# Patient Record
Sex: Female | Born: 1935 | Race: White | Hispanic: No | Marital: Single | Smoking: Current every day smoker
Health system: Southern US, Community
[De-identification: ages and names within clinical notes are randomized; demographics above are authoritative.]

## PROBLEM LIST (undated history)

## (undated) DIAGNOSIS — D649 Anemia, unspecified: Secondary | ICD-10-CM

## (undated) DIAGNOSIS — M1990 Unspecified osteoarthritis, unspecified site: Secondary | ICD-10-CM

## (undated) DIAGNOSIS — R0989 Other specified symptoms and signs involving the circulatory and respiratory systems: Secondary | ICD-10-CM

## (undated) DIAGNOSIS — T7840XA Allergy, unspecified, initial encounter: Secondary | ICD-10-CM

## (undated) DIAGNOSIS — R040 Epistaxis: Secondary | ICD-10-CM

## (undated) DIAGNOSIS — J449 Chronic obstructive pulmonary disease, unspecified: Secondary | ICD-10-CM

## (undated) DIAGNOSIS — C3490 Malignant neoplasm of unspecified part of unspecified bronchus or lung: Secondary | ICD-10-CM

## (undated) DIAGNOSIS — H269 Unspecified cataract: Secondary | ICD-10-CM

## (undated) DIAGNOSIS — I10 Essential (primary) hypertension: Secondary | ICD-10-CM

## (undated) DIAGNOSIS — Z87440 Personal history of urinary (tract) infections: Secondary | ICD-10-CM

## (undated) DIAGNOSIS — M0579 Rheumatoid arthritis with rheumatoid factor of multiple sites without organ or systems involvement: Secondary | ICD-10-CM

## (undated) DIAGNOSIS — K219 Gastro-esophageal reflux disease without esophagitis: Secondary | ICD-10-CM

## (undated) DIAGNOSIS — R221 Localized swelling, mass and lump, neck: Secondary | ICD-10-CM

## (undated) HISTORY — DX: Allergy, unspecified, initial encounter: T78.40XA

## (undated) HISTORY — DX: Localized swelling, mass and lump, neck: R22.1

## (undated) HISTORY — DX: Malignant neoplasm of unspecified part of unspecified bronchus or lung: C34.90

## (undated) HISTORY — DX: Unspecified cataract: H26.9

## (undated) HISTORY — DX: Chronic obstructive pulmonary disease, unspecified: J44.9

## (undated) HISTORY — PX: ABDOMINAL HYSTERECTOMY: SHX81

## (undated) HISTORY — DX: Personal history of urinary (tract) infections: Z87.440

## (undated) HISTORY — PX: APPENDECTOMY: SHX54

## (undated) HISTORY — DX: Unspecified osteoarthritis, unspecified site: M19.90

## (undated) HISTORY — DX: Other specified symptoms and signs involving the circulatory and respiratory systems: R09.89

## (undated) HISTORY — DX: Rheumatoid arthritis with rheumatoid factor of multiple sites without organ or systems involvement: M05.79

## (undated) HISTORY — PX: NECK LESION BIOPSY: SHX2078

## (undated) HISTORY — DX: Epistaxis: R04.0

## (undated) HISTORY — DX: Gastro-esophageal reflux disease without esophagitis: K21.9

## (undated) HISTORY — PX: HIP SURGERY: SHX245

## (undated) HISTORY — DX: Anemia, unspecified: D64.9

## (undated) HISTORY — DX: Essential (primary) hypertension: I10

---

## 2013-11-29 ENCOUNTER — Other Ambulatory Visit: Payer: Self-pay | Admitting: Family Medicine

## 2013-11-29 LAB — CBC WITH DIFFERENTIAL/PLATELET
BASOS PCT: 0.6 %
Basophil #: 0.1 10*3/uL (ref 0.0–0.1)
Eosinophil #: 0.1 10*3/uL (ref 0.0–0.7)
Eosinophil %: 1.3 %
HCT: 32 % — ABNORMAL LOW (ref 35.0–47.0)
HGB: 10.6 g/dL — ABNORMAL LOW (ref 12.0–16.0)
LYMPHS PCT: 17.4 %
Lymphocyte #: 1.8 10*3/uL (ref 1.0–3.6)
MCH: 27.2 pg (ref 26.0–34.0)
MCHC: 33.2 g/dL (ref 32.0–36.0)
MCV: 82 fL (ref 80–100)
Monocyte #: 1.1 x10 3/mm — ABNORMAL HIGH (ref 0.2–0.9)
Monocyte %: 10.9 %
NEUTROS PCT: 69.8 %
Neutrophil #: 7.1 10*3/uL — ABNORMAL HIGH (ref 1.4–6.5)
Platelet: 703 10*3/uL — ABNORMAL HIGH (ref 150–440)
RBC: 3.91 10*6/uL (ref 3.80–5.20)
RDW: 15.9 % — ABNORMAL HIGH (ref 11.5–14.5)
WBC: 10.1 10*3/uL (ref 3.6–11.0)

## 2013-11-29 LAB — BASIC METABOLIC PANEL
ANION GAP: 11 (ref 7–16)
BUN: 14 mg/dL (ref 7–18)
Calcium, Total: 8.3 mg/dL — ABNORMAL LOW (ref 8.5–10.1)
Chloride: 99 mmol/L (ref 98–107)
Co2: 26 mmol/L (ref 21–32)
Creatinine: 0.53 mg/dL — ABNORMAL LOW (ref 0.60–1.30)
Glucose: 68 mg/dL (ref 65–99)
Osmolality: 271 (ref 275–301)
POTASSIUM: 4.5 mmol/L (ref 3.5–5.1)
Sodium: 136 mmol/L (ref 136–145)

## 2013-11-29 LAB — SEDIMENTATION RATE: Erythrocyte Sed Rate: 72 mm/hr — ABNORMAL HIGH (ref 0–30)

## 2013-11-29 LAB — VANCOMYCIN, TROUGH: VANCOMYCIN, TROUGH: 22 ug/mL — AB (ref 10–20)

## 2013-11-30 ENCOUNTER — Other Ambulatory Visit: Payer: Self-pay | Admitting: Family Medicine

## 2013-11-30 LAB — CREATININE, SERUM
Creatinine: 0.47 mg/dL — ABNORMAL LOW (ref 0.60–1.30)
EGFR (Non-African Amer.): >60

## 2013-11-30 LAB — VANCOMYCIN, TROUGH: Vancomycin, Trough: 16 ug/mL (ref 10–20)

## 2013-12-05 ENCOUNTER — Other Ambulatory Visit: Payer: Self-pay | Admitting: Family Medicine

## 2013-12-05 LAB — CBC WITH DIFFERENTIAL/PLATELET
Basophil #: 0.1 10*3/uL (ref 0.0–0.1)
Basophil %: 0.7 %
Eosinophil #: 0.1 10*3/uL (ref 0.0–0.7)
Eosinophil %: 1.1 %
HCT: 31.7 % — ABNORMAL LOW (ref 35.0–47.0)
HGB: 10 g/dL — AB (ref 12.0–16.0)
Lymphocyte #: 3.3 10*3/uL (ref 1.0–3.6)
Lymphocyte %: 26.7 %
MCH: 25.9 pg — AB (ref 26.0–34.0)
MCHC: 31.7 g/dL — ABNORMAL LOW (ref 32.0–36.0)
MCV: 82 fL (ref 80–100)
MONO ABS: 1.4 x10 3/mm — AB (ref 0.2–0.9)
Monocyte %: 11.4 %
Neutrophil #: 7.4 10*3/uL — ABNORMAL HIGH (ref 1.4–6.5)
Neutrophil %: 60.1 %
Platelet: 677 10*3/uL — ABNORMAL HIGH (ref 150–440)
RBC: 3.87 10*6/uL (ref 3.80–5.20)
RDW: 16.4 % — ABNORMAL HIGH (ref 11.5–14.5)
WBC: 12.2 10*3/uL — AB (ref 3.6–11.0)

## 2013-12-05 LAB — BASIC METABOLIC PANEL
ANION GAP: 8 (ref 7–16)
BUN: 5 mg/dL — AB (ref 7–18)
CHLORIDE: 101 mmol/L (ref 98–107)
CREATININE: 0.46 mg/dL — AB (ref 0.60–1.30)
Calcium, Total: 8 mg/dL — ABNORMAL LOW (ref 8.5–10.1)
Co2: 27 mmol/L (ref 21–32)
EGFR (African American): >60
EGFR (Non-African Amer.): >60
GLUCOSE: 89 mg/dL (ref 65–99)
OSMOLALITY: 269 (ref 275–301)
POTASSIUM: 3.6 mmol/L (ref 3.5–5.1)
SODIUM: 136 mmol/L (ref 136–145)

## 2013-12-05 LAB — VANCOMYCIN, TROUGH: Vancomycin, Trough: 14 ug/mL (ref 10–20)

## 2013-12-05 LAB — SEDIMENTATION RATE: ERYTHROCYTE SED RATE: 50 mm/h — AB (ref 0–30)

## 2013-12-08 ENCOUNTER — Other Ambulatory Visit: Payer: Self-pay | Admitting: Family Medicine

## 2013-12-08 LAB — VANCOMYCIN, TROUGH: VANCOMYCIN, TROUGH: 14 ug/mL (ref 10–20)

## 2013-12-08 LAB — CREATININE, SERUM
Creatinine: 0.56 mg/dL — ABNORMAL LOW (ref 0.60–1.30)
EGFR (African American): >60

## 2013-12-13 ENCOUNTER — Other Ambulatory Visit: Payer: Self-pay | Admitting: Family Medicine

## 2013-12-13 LAB — CBC WITH DIFFERENTIAL/PLATELET
Basophil #: 0.1 10*3/uL (ref 0.0–0.1)
Basophil %: 0.8 %
Eosinophil #: 0.2 10*3/uL (ref 0.0–0.7)
Eosinophil %: 1.7 %
HCT: 31.1 % — AB (ref 35.0–47.0)
HGB: 9.9 g/dL — ABNORMAL LOW (ref 12.0–16.0)
LYMPHS ABS: 3.5 10*3/uL (ref 1.0–3.6)
Lymphocyte %: 31.3 %
MCH: 27.1 pg (ref 26.0–34.0)
MCHC: 31.9 g/dL — ABNORMAL LOW (ref 32.0–36.0)
MCV: 85 fL (ref 80–100)
MONOS PCT: 10.6 %
Monocyte #: 1.2 x10 3/mm — ABNORMAL HIGH (ref 0.2–0.9)
Neutrophil #: 6.2 10*3/uL (ref 1.4–6.5)
Neutrophil %: 55.6 %
PLATELETS: 614 10*3/uL — AB (ref 150–440)
RBC: 3.66 10*6/uL — ABNORMAL LOW (ref 3.80–5.20)
RDW: 20 % — ABNORMAL HIGH (ref 11.5–14.5)
WBC: 11.1 10*3/uL — ABNORMAL HIGH (ref 3.6–11.0)

## 2013-12-13 LAB — COMPREHENSIVE METABOLIC PANEL
ALBUMIN: 2 g/dL — AB (ref 3.4–5.0)
ALK PHOS: 221 U/L — AB
Anion Gap: 8 (ref 7–16)
BILIRUBIN TOTAL: 0.5 mg/dL (ref 0.2–1.0)
BUN: 10 mg/dL (ref 7–18)
CALCIUM: 8 mg/dL — AB (ref 8.5–10.1)
CREATININE: 0.48 mg/dL — AB (ref 0.60–1.30)
Chloride: 103 mmol/L (ref 98–107)
Co2: 28 mmol/L (ref 21–32)
EGFR (African American): >60
Glucose: 93 mg/dL (ref 65–99)
OSMOLALITY: 276 (ref 275–301)
Potassium: 3.1 mmol/L — ABNORMAL LOW (ref 3.5–5.1)
SGOT(AST): 50 U/L — ABNORMAL HIGH (ref 15–37)
SGPT (ALT): 20 U/L (ref 12–78)
SODIUM: 139 mmol/L (ref 136–145)
Total Protein: 6.6 g/dL (ref 6.4–8.2)

## 2013-12-13 LAB — VANCOMYCIN, TROUGH: Vancomycin, Trough: 18 ug/mL (ref 10–20)

## 2013-12-14 ENCOUNTER — Other Ambulatory Visit: Payer: Self-pay | Admitting: Family Medicine

## 2013-12-14 LAB — SEDIMENTATION RATE: Erythrocyte Sed Rate: 63 mm/hr — ABNORMAL HIGH (ref 0–30)

## 2013-12-16 ENCOUNTER — Other Ambulatory Visit: Payer: Self-pay | Admitting: Family Medicine

## 2013-12-16 LAB — CREATININE, SERUM
Creatinine: 0.94 mg/dL (ref 0.60–1.30)
EGFR (Non-African Amer.): 56 — ABNORMAL LOW

## 2013-12-16 LAB — VANCOMYCIN, TROUGH: VANCOMYCIN, TROUGH: 14 ug/mL (ref 10–20)

## 2013-12-19 ENCOUNTER — Other Ambulatory Visit: Payer: Self-pay | Admitting: Family Medicine

## 2013-12-19 LAB — CBC WITH DIFFERENTIAL/PLATELET
BASOS PCT: 1.2 %
Basophil #: 0.1 10*3/uL (ref 0.0–0.1)
Eosinophil #: 0.4 10*3/uL (ref 0.0–0.7)
Eosinophil %: 3.5 %
HCT: 32 % — ABNORMAL LOW (ref 35.0–47.0)
HGB: 10.1 g/dL — ABNORMAL LOW (ref 12.0–16.0)
LYMPHS ABS: 2.9 10*3/uL (ref 1.0–3.6)
Lymphocyte %: 26.4 %
MCH: 27.5 pg (ref 26.0–34.0)
MCHC: 31.5 g/dL — ABNORMAL LOW (ref 32.0–36.0)
MCV: 87 fL (ref 80–100)
Monocyte #: 1.2 x10 3/mm — ABNORMAL HIGH (ref 0.2–0.9)
Monocyte %: 10.3 %
Neutrophil #: 6.6 10*3/uL — ABNORMAL HIGH (ref 1.4–6.5)
Neutrophil %: 58.6 %
Platelet: 597 10*3/uL — ABNORMAL HIGH (ref 150–440)
RBC: 3.67 10*6/uL — ABNORMAL LOW (ref 3.80–5.20)
RDW: 21.6 % — ABNORMAL HIGH (ref 11.5–14.5)
WBC: 11.2 10*3/uL — ABNORMAL HIGH (ref 3.6–11.0)

## 2013-12-19 LAB — BASIC METABOLIC PANEL
Anion Gap: 5 — ABNORMAL LOW (ref 7–16)
BUN: 9 mg/dL (ref 7–18)
CREATININE: 0.62 mg/dL (ref 0.60–1.30)
Calcium, Total: 8.4 mg/dL — ABNORMAL LOW (ref 8.5–10.1)
Chloride: 103 mmol/L (ref 98–107)
Co2: 29 mmol/L (ref 21–32)
EGFR (African American): >60
Glucose: 81 mg/dL (ref 65–99)
OSMOLALITY: 272 (ref 275–301)
POTASSIUM: 4 mmol/L (ref 3.5–5.1)
Sodium: 137 mmol/L (ref 136–145)

## 2013-12-19 LAB — SEDIMENTATION RATE: ERYTHROCYTE SED RATE: 65 mm/h — AB (ref 0–30)

## 2013-12-20 ENCOUNTER — Other Ambulatory Visit: Payer: Self-pay | Admitting: Family Medicine

## 2013-12-20 LAB — CREATININE, SERUM
Creatinine: 0.49 mg/dL — ABNORMAL LOW (ref 0.60–1.30)
EGFR (African American): >60
EGFR (Non-African Amer.): >60

## 2013-12-20 LAB — VANCOMYCIN, TROUGH: VANCOMYCIN, TROUGH: 18 ug/mL (ref 10–20)

## 2013-12-22 ENCOUNTER — Other Ambulatory Visit: Payer: Self-pay | Admitting: Family Medicine

## 2013-12-22 LAB — CREATININE, SERUM
Creatinine: 0.72 mg/dL (ref 0.60–1.30)
EGFR (African American): >60

## 2013-12-22 LAB — HEPATIC FUNCTION PANEL A (ARMC)
ALBUMIN: 2.3 g/dL — AB (ref 3.4–5.0)
AST: 41 U/L — AB (ref 15–37)
Alkaline Phosphatase: 196 U/L — ABNORMAL HIGH
Bilirubin, Direct: 0.1 mg/dL (ref 0.00–0.20)
Bilirubin,Total: 0.4 mg/dL (ref 0.2–1.0)
SGPT (ALT): 17 U/L (ref 12–78)
Total Protein: 6.5 g/dL (ref 6.4–8.2)

## 2013-12-22 LAB — VANCOMYCIN, TROUGH: Vancomycin, Trough: 16 ug/mL (ref 10–20)

## 2016-04-29 ENCOUNTER — Ambulatory Visit
Admission: EM | Admit: 2016-04-29 | Discharge: 2016-04-29 | Disposition: A | Discharge status: Home / Self Care | Payer: Medicare Other | Attending: Family Medicine | Admitting: Family Medicine

## 2016-04-29 DIAGNOSIS — R1013 Epigastric pain: Secondary | ICD-10-CM

## 2016-04-29 DIAGNOSIS — R195 Other fecal abnormalities: Secondary | ICD-10-CM | POA: Diagnosis not present

## 2016-04-29 NOTE — ED Triage Notes (Signed)
Patient was hospitalized for C. Diff, she was disdcharged 1 week ago Friday,  saw her PCP on Tuesday and they prescribed Vancomycin. She feels like something is eating through her stomach. Diarrhea has been off and on since Tuesday.

## 2016-04-29 NOTE — ED Provider Notes (Signed)
[facility] ____________________________________________  Time seen: Approximately 6:38 PM  I have reviewed the triage vital signs and the nursing notes.   HISTORY  Chief Complaint Abdominal Pain   HPI Melinda Cobb is a 82 y.o. female patient presenting with son at bedside for the complaints of abdominal pain. Patient reports generalized abdominal pain worse in the epigastric area. Patient reports is been present for 1 week with associated black tarry stools. Patient describes her stool as coffee ground appearing. Patient denies any acute worsening of her abdominal pain. Patient reports decreased appetite and occasional nausea. Patient reports that she is drinking fluids well.  Patient reports that she was just discharged from [facility] due to C. difficile abdominal pain. Patient reports that she was originally being treated with Flagyl however was not tolerating well and is now currently on vancomycin. Patient reports that she is also been taken Bactrim due to a recurrent infected left hip joint.  Denies vomiting, diarrhea or constipation. Denies any other abnormal bleeding. Patient reports some generalized weakness. Denies chest pain, shortness of breath, dysuria. Patient reports that she caught her primary care physician today and they recommended for her to go to the urgent care or ER setting to be further evaluated due to a possible ulcer. Patient reports that she does have a history of a bleeding ulcer approximately 3 years ago with secondary transfusion due to anemia. Patient reports nonambulatory at baseline.  [place] PCP   History reviewed. No pertinent past medical history.  There are no active problems to display for this patient.   History reviewed. No pertinent surgical history.  Current Outpatient Rx  . Order #: [id number] Class: Historical Med  . Order #: [id number] Class: Historical Med  . Order #: [id number] Class: Historical Med  .  Order #: [id number] Class: Historical Med  . Order #: [id number] Class: Historical Med  . Order #: [id number] Class: Historical Med  . Order #: [id number] Class: Historical Med    No current facility-administered medications for this encounter.   Current Outpatient Prescriptions:  .  AMLODIPINE BESYLATE PO, Take 5 mg by mouth daily., Disp: , Rfl:  .  aspirin 81 MG chewable tablet, Chew by mouth daily., Disp: , Rfl:  .  atenolol (TENORMIN) 25 MG tablet, Take by mouth 2 (two) times daily., Disp: , Rfl:  .  Probiotic Product (TRUBIOTICS PO), Take by mouth., Disp: , Rfl:  .  simethicone (MYLICON) 353 MG chewable tablet, Chew 125 mg by mouth every 6 (six) hours as needed for flatulence., Disp: , Rfl:  .  sulfamethoxazole-trimethoprim (BACTRIM DS,SEPTRA DS) 800-160 MG tablet, Take 1 tablet by mouth daily., Disp: , Rfl:  .  vancomycin (VANCOCIN) 125 MG capsule, Take 125 mg by mouth 4 (four) times daily., Disp: , Rfl:   Allergies Review of patient's allergies indicates not on file.  History reviewed. No pertinent family history.  Social History Social History  Substance Use Topics  . Smoking status: Never Smoker  . Smokeless tobacco: Never Used  . Alcohol use No    Review of Systems Constitutional: No fever/chills Eyes: No visual changes. ENT: No sore throat. Cardiovascular: Denies chest pain. Respiratory: Denies shortness of breath. Gastrointestinal: As above No diarrhea.  No constipation. Genitourinary: Negative for dysuria. Musculoskeletal: Negative for back pain. Skin: Negative for rash. Neurological: Negative for headaches, focal weakness or numbness.  10-point ROS otherwise negative.  ____________________________________________   PHYSICAL EXAM:  VITAL SIGNS: ED Triage Vitals  Enc Vitals Group     BP 04/29/16 1741  122/64     Pulse Rate 04/29/16 1741 70     Resp 04/29/16 1741 18     Temp 04/29/16 1741 98.1 F (36.7 C)     Temp Source 04/29/16 1741 Oral     SpO2 04/29/16  1741 93 %     Weight 04/29/16 1735 88 lb (39.9 kg)     Height 04/29/16 1735 '5\' 4"'$  (1.626 m)     Head Circumference --      Peak Flow --      Pain Score 04/29/16 1740 5     Pain Loc --      Pain Edu? --      Excl. in [place]? --     Constitutional: Alert and oriented. Well appearing and in no acute distress. Eyes: Conjunctivae are normal. PERRL. EOMI. ENT      Head: Normocephalic and atraumatic.      Mouth/Throat: Mucous membranes are moist. Cardiovascular: Normal rate, regular rhythm. Grossly normal heart sounds.  Good peripheral circulation. Respiratory: Normal respiratory effort without tachypnea nor retractions. Breath sounds are clear and equal bilaterally. No wheezes/rales/rhonchi.. Gastrointestinal: Mild to moderate epigastric tenderness to palpation. Non guarding. Normal Bowel sounds.  Musculoskeletal:   No midline cervical, thoracic or lumbar tenderness to palpation.  Neurologic:  Normal speech and language. Speech is normal.  Skin:  Skin is warm, dry and intact. No rash noted. Psychiatric: Mood and affect are normal. Speech and behavior are normal. Patient exhibits appropriate insight and judgment   ___________________________________________   LABS (all labs ordered are listed, but only abnormal results are displayed)  Labs Reviewed - No data to display   PROCEDURES Procedures    INITIAL IMPRESSION / ASSESSMENT AND PLAN / ED COURSE  Pertinent labs & imaging results that were available during my care of the patient were reviewed by me and considered in my medical decision making (see chart for details).  Patient presenting with child's abdominal pain, worse in epigastric area with associated black tarry stools. Patient reports present 1 week. Patient currently being treated outpatient for C. difficile. Recent hospitalization and discharged just one week ago. Discussed in detail with patient due to weakness, black stools, abdominal pain with history of transfusion second  to GI bleed recommended for patient to be seen in emergency room at this time. Patient vital signs stable. Patient alert and oriented with decisional capacity and states that her son will take her to [facility]. Lauri RN called to [facility] with report. Patient stable at time of discharge and transfer.  Discussed follow up with Primary care physician this week. Discussed follow up and return parameters including no resolution or any worsening concerns. Patient verbalized understanding and agreed to plan.   ____________________________________________   FINAL CLINICAL IMPRESSION(S) / ED DIAGNOSES  Final diagnoses:  Epigastric pain  Dark stools     Discharge Medication List as of 04/29/2016  6:34 PM      Note: This dictation was prepared with Dragon dictation along with smaller phrase technology. Any transcriptional errors that result from this process are unintentional.    Clinical Course      Marylene Land, NP 04/29/16 2131

## 2016-05-03 ENCOUNTER — Telehealth: Payer: Self-pay

## 2016-05-03 NOTE — Telephone Encounter (Signed)
Courtesy call back completed today after patient's visit at [facility]. Patient improved and will call back with any questions or concerns.  

## 2016-05-26 ENCOUNTER — Other Ambulatory Visit: Payer: Self-pay | Admitting: Family Medicine

## 2016-05-26 DIAGNOSIS — R221 Localized swelling, mass and lump, neck: Secondary | ICD-10-CM

## 2016-06-02 ENCOUNTER — Ambulatory Visit
Admission: RE | Admit: 2016-06-02 | Discharge: 2016-06-02 | Disposition: A | Discharge status: Home / Self Care | Payer: Medicare Other | Source: Ambulatory Visit | Attending: Family Medicine | Admitting: Family Medicine

## 2016-06-02 DIAGNOSIS — R221 Localized swelling, mass and lump, neck: Secondary | ICD-10-CM | POA: Insufficient documentation

## 2016-07-08 ENCOUNTER — Encounter: Payer: Self-pay | Admitting: Internal Medicine

## 2016-07-08 ENCOUNTER — Other Ambulatory Visit: Payer: Self-pay | Admitting: *Deleted

## 2016-07-08 ENCOUNTER — Inpatient Hospital Stay: Payer: Medicare Other | Attending: Internal Medicine | Admitting: Internal Medicine

## 2016-07-08 DIAGNOSIS — C801 Malignant (primary) neoplasm, unspecified: Secondary | ICD-10-CM | POA: Diagnosis not present

## 2016-07-08 DIAGNOSIS — R634 Abnormal weight loss: Secondary | ICD-10-CM | POA: Insufficient documentation

## 2016-07-08 DIAGNOSIS — M069 Rheumatoid arthritis, unspecified: Secondary | ICD-10-CM | POA: Diagnosis not present

## 2016-07-08 DIAGNOSIS — R0602 Shortness of breath: Secondary | ICD-10-CM | POA: Diagnosis not present

## 2016-07-08 DIAGNOSIS — N281 Cyst of kidney, acquired: Secondary | ICD-10-CM | POA: Diagnosis not present

## 2016-07-08 DIAGNOSIS — C3490 Malignant neoplasm of unspecified part of unspecified bronchus or lung: Secondary | ICD-10-CM | POA: Insufficient documentation

## 2016-07-08 DIAGNOSIS — I4891 Unspecified atrial fibrillation: Secondary | ICD-10-CM | POA: Diagnosis not present

## 2016-07-08 DIAGNOSIS — J449 Chronic obstructive pulmonary disease, unspecified: Secondary | ICD-10-CM | POA: Diagnosis not present

## 2016-07-08 DIAGNOSIS — I700 Atherosclerosis of aorta: Secondary | ICD-10-CM | POA: Insufficient documentation

## 2016-07-08 DIAGNOSIS — Z87440 Personal history of urinary (tract) infections: Secondary | ICD-10-CM | POA: Insufficient documentation

## 2016-07-08 DIAGNOSIS — K219 Gastro-esophageal reflux disease without esophagitis: Secondary | ICD-10-CM | POA: Insufficient documentation

## 2016-07-08 DIAGNOSIS — M549 Dorsalgia, unspecified: Secondary | ICD-10-CM | POA: Insufficient documentation

## 2016-07-08 DIAGNOSIS — G8929 Other chronic pain: Secondary | ICD-10-CM | POA: Diagnosis not present

## 2016-07-08 DIAGNOSIS — C770 Secondary and unspecified malignant neoplasm of lymph nodes of head, face and neck: Secondary | ICD-10-CM | POA: Insufficient documentation

## 2016-07-08 DIAGNOSIS — Z800 Family history of malignant neoplasm of digestive organs: Secondary | ICD-10-CM

## 2016-07-08 DIAGNOSIS — D649 Anemia, unspecified: Secondary | ICD-10-CM | POA: Diagnosis not present

## 2016-07-08 DIAGNOSIS — F17210 Nicotine dependence, cigarettes, uncomplicated: Secondary | ICD-10-CM | POA: Diagnosis not present

## 2016-07-08 DIAGNOSIS — Z79899 Other long term (current) drug therapy: Secondary | ICD-10-CM | POA: Insufficient documentation

## 2016-07-08 DIAGNOSIS — Z7982 Long term (current) use of aspirin: Secondary | ICD-10-CM | POA: Diagnosis not present

## 2016-07-08 DIAGNOSIS — R51 Headache: Secondary | ICD-10-CM | POA: Diagnosis not present

## 2016-07-08 DIAGNOSIS — I10 Essential (primary) hypertension: Secondary | ICD-10-CM | POA: Insufficient documentation

## 2016-07-08 DIAGNOSIS — Z8619 Personal history of other infectious and parasitic diseases: Secondary | ICD-10-CM | POA: Diagnosis not present

## 2016-07-08 DIAGNOSIS — M129 Arthropathy, unspecified: Secondary | ICD-10-CM | POA: Insufficient documentation

## 2016-07-08 DIAGNOSIS — Z8049 Family history of malignant neoplasm of other genital organs: Secondary | ICD-10-CM

## 2016-07-08 DIAGNOSIS — R630 Anorexia: Secondary | ICD-10-CM | POA: Insufficient documentation

## 2016-07-08 DIAGNOSIS — E279 Disorder of adrenal gland, unspecified: Secondary | ICD-10-CM | POA: Insufficient documentation

## 2016-07-08 LAB — COMPREHENSIVE METABOLIC PANEL
ALK PHOS: 100 U/L (ref 38–126)
ALT: 12 U/L — AB (ref 14–54)
AST: 20 U/L (ref 15–41)
Albumin: 4.1 g/dL (ref 3.5–5.0)
Anion gap: 4 — ABNORMAL LOW (ref 5–15)
BILIRUBIN TOTAL: 0.6 mg/dL (ref 0.3–1.2)
BUN: 21 mg/dL — AB (ref 6–20)
CALCIUM: 9.3 mg/dL (ref 8.9–10.3)
CO2: 24 mmol/L (ref 22–32)
CREATININE: 0.79 mg/dL (ref 0.44–1.00)
Chloride: 105 mmol/L (ref 101–111)
Glucose, Bld: 98 mg/dL (ref 65–99)
Potassium: 4.3 mmol/L (ref 3.5–5.1)
Sodium: 133 mmol/L — ABNORMAL LOW (ref 135–145)
Total Protein: 7.7 g/dL (ref 6.5–8.1)

## 2016-07-08 LAB — CBC WITH DIFFERENTIAL/PLATELET
Basophils Absolute: 0.1 10*3/uL (ref 0–0.1)
Basophils Relative: 1 %
EOS PCT: 4 %
Eosinophils Absolute: 0.2 10*3/uL (ref 0–0.7)
HEMATOCRIT: 46.5 % (ref 35.0–47.0)
HEMOGLOBIN: 15.5 g/dL (ref 12.0–16.0)
LYMPHS ABS: 1.2 10*3/uL (ref 1.0–3.6)
LYMPHS PCT: 20 %
MCH: 30.5 pg (ref 26.0–34.0)
MCHC: 33.3 g/dL (ref 32.0–36.0)
MCV: 91.5 fL (ref 80.0–100.0)
Monocytes Absolute: 0.8 10*3/uL (ref 0.2–0.9)
Monocytes Relative: 14 %
NEUTROS ABS: 3.5 10*3/uL (ref 1.4–6.5)
NEUTROS PCT: 61 %
Platelets: 341 10*3/uL (ref 150–440)
RBC: 5.08 MIL/uL (ref 3.80–5.20)
RDW: 15.3 % — ABNORMAL HIGH (ref 11.5–14.5)
WBC: 5.7 10*3/uL (ref 3.6–11.0)

## 2016-07-08 LAB — LACTATE DEHYDROGENASE: LDH: 134 U/L (ref 98–192)

## 2016-07-08 NOTE — Progress Notes (Signed)
[place] CONSULT NOTE  Patient Care Team: Saunemin as PCP - General  CHIEF COMPLAINTS/PURPOSE OF CONSULTATION: Lung cancer   #  Oncology History   # LEFT SUPRACLAV LN FNA- ADENO CA s/o Primary Lung ca; EGFR/PDL-1-Pending  # COPD/     Bronchogenic lung cancer, unspecified laterality ([place])   07/08/2016 Initial Diagnosis    Bronchogenic lung cancer, unspecified laterality ([place])       HISTORY OF PRESENTING ILLNESS:  Melinda Cobb 82 y.o.  female long-standing history of smoking/COPD was recently evaluated at [facility] for a left neck lymph node which was subsequently biopsied and found to have- metastatic adenocarcinoma likely of primary lung origin. Patient has not had CT of the chest yet.  Patient noted to have the lump approximately 2 months ago; she thinks is growing. She denies any pain from it.   She has chronic shortness of breath from her long-standing COPD. Not on home O2. History of A. Fib- not on anticoagulation. Patient has intermittent headaches. Patient is wheelchair bound because of multiple orthopedic surgeries/left hip; she goes around with a electric wheelchair at home. Complains of poor appetite. History of recent C. difficile weight loss. Chronic back pain. Pain.  ROS: A complete 10 point review of system is done which is negative except mentioned above in history of present illness  MEDICAL HISTORY:  Past Medical History:  Diagnosis Date  . Allergy   . Anemia   . Arthritis   . Cataract   . COPD (chronic obstructive pulmonary disease) ([place])   . GERD (gastroesophageal reflux disease)   . Hx of bladder infections    per pt  . Hypertension   . Lung cancer ([place])   . Neck mass   . Nosebleed    from Eliquis  . Pulse irregularity    per pt   . Seropositive rheumatoid arthritis of multiple sites [facility])     SURGICAL HISTORY: Past Surgical History:  Procedure Laterality Date  . ABDOMINAL HYSTERECTOMY    . APPENDECTOMY    . HIP  SURGERY     x 5 surgeries per pt  . 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  Social History   Social History  . Marital status: Single    Spouse name: N/A  . Number of children: N/A  . Years of education: N/A   Occupational History  . Not on file.   Social History Main Topics  . Smoking status: Current Every Day Smoker    Packs/day: 1.00    Years: 69.00    Types: Cigarettes  . Smokeless tobacco: Never Used  . Alcohol use No  . Drug use: No  . Sexual activity: Not on file   Other Topics Concern  . Not on file   Social History Narrative  . No narrative on file    FAMILY HISTORY: Family History  Problem Relation Age of Onset  . Hypertension Mother   . Rectal cancer Mother   . Cervical cancer Mother   . Hypertension Father   . Coronary artery disease Father   . Coronary artery disease Sister     ALLERGIES:  has no allergies on file.  MEDICATIONS:  Current Outpatient Prescriptions  Medication Sig Dispense Refill  . acetaminophen (TYLENOL) 500 MG tablet Take 1,000 mg by mouth every 6 (six) hours as needed for moderate pain.    Marland Kitchen  AMLODIPINE BESYLATE PO Take 2.5 mg by mouth daily.     Marland Kitchen aspirin 81 MG chewable tablet Chew by mouth daily.    Marland Kitchen atenolol (TENORMIN) 25 MG tablet Take 25 mg by mouth daily as needed.     Marland Kitchen DIGOX 125 MCG tablet     . famotidine (PEPCID) 20 MG tablet Take 20 mg by mouth once.    . Probiotic Product (TRUBIOTICS PO) Take by mouth.    . simethicone (MYLICON) 578 MG chewable tablet Chew 125 mg by mouth every 6 (six) hours as needed for flatulence.    . sulfamethoxazole-trimethoprim (BACTRIM DS,SEPTRA DS) 800-160 MG tablet Take 1 tablet by mouth daily.     No current facility-administered medications for this visit.       Marland Kitchen  PHYSICAL EXAMINATION: ECOG PERFORMANCE STATUS: 3 - Symptomatic, >50% confined to bed  Vitals:   07/08/16 1511  BP: 130/70   Pulse: 78  Resp: 18  Temp: 97.7 F (36.5 C)   Filed Weights   07/08/16 1511  Weight: 86 lb 3.2 oz (39.1 kg)    GENERAL:  [place]; in wheel chair.  Alert, no distress and comfortable.   Accompanied by son.  EYES: no pallor or icterus OROPHARYNX: no thrush or ulceration; poor dentition NECK: supple, ~1-2 cm mobile hard mass felft in left neck.  LYMPH:  no palpable lymphadenopathy in the cervical, axillary or inguinal regions LUNGS: clear to auscultation and  No wheeze or crackles HEART/CVS: regular rate & rhythm and no murmurs; No lower extremity edema ABDOMEN: abdomen soft, non-tender and normal bowel sounds Musculoskeletal:no cyanosis of digits and no clubbing; positive for rheumatoid changes.  PSYCH: alert & oriented x 3 with fluent speech NEURO: no focal motor/sensory deficits SKIN:  no rashes or significant lesions  LABORATORY DATA:  I have reviewed the data as listed Lab Results  Component Value Date   WBC 5.7 07/08/2016   HGB 15.5 07/08/2016   HCT 46.5 07/08/2016   MCV 91.5 07/08/2016   PLT 341 07/08/2016   No results for input(s): NA, K, CL, CO2, GLUCOSE, BUN, CREATININE, CALCIUM, GFRNONAA, GFRAA, PROT, ALBUMIN, AST, ALT, ALKPHOS, BILITOT, BILIDIR, IBILI in the last 8760 hours.  IMPRESSION: Left level 5B peripherally enhancing lymph node measuring 1.1 x 0.8 cm, likely correlating to previously described lymph node from prior ultrasound report, which was noted to be approximately 8 cm below the left ear. Additional left infraclavicular lymphadenopathy is noted. Together, these findings are worrisome for malignancy.  --------------------------------------------------------------------------------------------------------------------------------------------      RADIOGRAPHIC STUDIES: I have personally reviewed the radiological images as listed and agreed with the findings in the report. No results found.  ASSESSMENT & PLAN:   Bronchogenic lung cancer,  unspecified laterality ([place]) # Adenocarcinoma- likely lung primary based on biopsy of left neck soft tissue mass. Stage IV. I would recommend MRI of the brain; and also CT of the chest for further evaluation.  #  Discussed that the treatments are incurable. Given the fraility; multiple medical problems- patient is a poor candidate for chemotherapy. Discussed regarding use of targeted pills- however patient is unlikely to be harboring driving mutations/ given the extensive smoking history. We'll try to get the records from [place]. Discussed regarding immunotherapy- however given her history of rheumatoid arthritis; flare of rheumatoid arthritis is a definite possibility.  # Right now patient is asymptomatic from her metastatic lung cancer. We will await above workup; follow-up with me in 1 week to discuss the next plan of care.  The above plan of care was discussed with the patient and her son in detail. Labs today.  Thank you for allowing me to participate in the care of your pleasant patient. Please do not hesitate to contact me with questions or concerns in the interim.   All questions were answered. The patient knows to call the clinic with any problems, questions or concerns.      Cammie Sickle, MD 07/08/2016 5:11 PM

## 2016-07-08 NOTE — Assessment & Plan Note (Signed)
#   Adenocarcinoma- likely lung primary based on biopsy of left neck soft tissue mass. Stage IV. I would recommend MRI of the brain; and also CT of the chest for further evaluation.  #  Discussed that the treatments are incurable. Given the fraility; multiple medical problems- patient is a poor candidate for chemotherapy. Discussed regarding use of targeted pills- however patient is unlikely to be harboring driving mutations/ given the extensive smoking history. We'll try to get the records from [place]. Discussed regarding immunotherapy- however given her history of rheumatoid arthritis; flare of rheumatoid arthritis is a definite possibility.  # Right now patient is asymptomatic from her metastatic lung cancer. We will await above workup; follow-up with me in 1 week to discuss the next plan of care.   The above plan of care was discussed with the patient and her son in detail. Labs today.  Thank you for allowing me to participate in the care of your pleasant patient. Please do not hesitate to contact me with questions or concerns in the interim.

## 2016-07-08 NOTE — Progress Notes (Signed)
Pt here for new pt visit past treatment at [facility]

## 2016-07-11 ENCOUNTER — Other Ambulatory Visit: Payer: Medicare Other

## 2016-07-11 ENCOUNTER — Ambulatory Visit
Admission: RE | Admit: 2016-07-11 | Discharge: 2016-07-11 | Disposition: A | Discharge status: Home / Self Care | Payer: Medicare Other | Source: Ambulatory Visit | Attending: Internal Medicine | Admitting: Internal Medicine

## 2016-07-11 DIAGNOSIS — C3490 Malignant neoplasm of unspecified part of unspecified bronchus or lung: Secondary | ICD-10-CM | POA: Diagnosis present

## 2016-07-11 DIAGNOSIS — I2510 Atherosclerotic heart disease of native coronary artery without angina pectoris: Secondary | ICD-10-CM | POA: Insufficient documentation

## 2016-07-11 DIAGNOSIS — I700 Atherosclerosis of aorta: Secondary | ICD-10-CM | POA: Insufficient documentation

## 2016-07-11 MED ORDER — IOPAMIDOL (ISOVUE-300) INJECTION 61%
75.0000 mL | Freq: Once | INTRAVENOUS | Status: AC | PRN
  Administered 2016-07-11: 75 mL via INTRAVENOUS

## 2016-07-15 ENCOUNTER — Inpatient Hospital Stay (HOSPITAL_BASED_OUTPATIENT_CLINIC_OR_DEPARTMENT_OTHER): Payer: Medicare Other | Admitting: Internal Medicine

## 2016-07-15 ENCOUNTER — Encounter: Payer: Self-pay | Admitting: Internal Medicine

## 2016-07-15 DIAGNOSIS — M129 Arthropathy, unspecified: Secondary | ICD-10-CM

## 2016-07-15 DIAGNOSIS — D649 Anemia, unspecified: Secondary | ICD-10-CM

## 2016-07-15 DIAGNOSIS — I10 Essential (primary) hypertension: Secondary | ICD-10-CM

## 2016-07-15 DIAGNOSIS — Z8049 Family history of malignant neoplasm of other genital organs: Secondary | ICD-10-CM

## 2016-07-15 DIAGNOSIS — C770 Secondary and unspecified malignant neoplasm of lymph nodes of head, face and neck: Secondary | ICD-10-CM | POA: Diagnosis not present

## 2016-07-15 DIAGNOSIS — Z7982 Long term (current) use of aspirin: Secondary | ICD-10-CM

## 2016-07-15 DIAGNOSIS — R51 Headache: Secondary | ICD-10-CM

## 2016-07-15 DIAGNOSIS — N281 Cyst of kidney, acquired: Secondary | ICD-10-CM | POA: Diagnosis not present

## 2016-07-15 DIAGNOSIS — R634 Abnormal weight loss: Secondary | ICD-10-CM

## 2016-07-15 DIAGNOSIS — Z800 Family history of malignant neoplasm of digestive organs: Secondary | ICD-10-CM

## 2016-07-15 DIAGNOSIS — E279 Disorder of adrenal gland, unspecified: Secondary | ICD-10-CM

## 2016-07-15 DIAGNOSIS — Z8619 Personal history of other infectious and parasitic diseases: Secondary | ICD-10-CM

## 2016-07-15 DIAGNOSIS — M069 Rheumatoid arthritis, unspecified: Secondary | ICD-10-CM

## 2016-07-15 DIAGNOSIS — Z79899 Other long term (current) drug therapy: Secondary | ICD-10-CM

## 2016-07-15 DIAGNOSIS — R630 Anorexia: Secondary | ICD-10-CM

## 2016-07-15 DIAGNOSIS — C3432 Malignant neoplasm of lower lobe, left bronchus or lung: Secondary | ICD-10-CM | POA: Insufficient documentation

## 2016-07-15 DIAGNOSIS — M549 Dorsalgia, unspecified: Secondary | ICD-10-CM

## 2016-07-15 DIAGNOSIS — C801 Malignant (primary) neoplasm, unspecified: Secondary | ICD-10-CM

## 2016-07-15 DIAGNOSIS — K219 Gastro-esophageal reflux disease without esophagitis: Secondary | ICD-10-CM

## 2016-07-15 DIAGNOSIS — Z87440 Personal history of urinary (tract) infections: Secondary | ICD-10-CM

## 2016-07-15 DIAGNOSIS — J449 Chronic obstructive pulmonary disease, unspecified: Secondary | ICD-10-CM

## 2016-07-15 DIAGNOSIS — G8929 Other chronic pain: Secondary | ICD-10-CM

## 2016-07-15 DIAGNOSIS — I700 Atherosclerosis of aorta: Secondary | ICD-10-CM

## 2016-07-15 DIAGNOSIS — I4891 Unspecified atrial fibrillation: Secondary | ICD-10-CM

## 2016-07-15 DIAGNOSIS — F17210 Nicotine dependence, cigarettes, uncomplicated: Secondary | ICD-10-CM

## 2016-07-15 DIAGNOSIS — R0602 Shortness of breath: Secondary | ICD-10-CM

## 2016-07-15 NOTE — Progress Notes (Signed)
Patient here today for results on recent CT scan

## 2016-07-15 NOTE — Assessment & Plan Note (Addendum)
#  Adenocarcinoma- likely lung primary based on biopsy of left neck soft tissue mass. Stage IV. CT shows left lower lung nodules highly suspicious for malignancy; also left adrenal nodule. [place]; Not mutated; PDL-1 testing cannot be performed on FNA [patient not interested in biopsy].  # Discussed the treatments are palliative. Given the fraility; multiple medical problems- patient is a poor candidate for chemotherapy. Patient not candidate for any anti-EGFR therapy. Not a good candidate for immunotherapy given her severe rheumatoid arthritis. In general patient is also not taking any treatments citing multiple medical issues/poor tolerance to medications etc.  # Awaiting MRI of the brain- IF abnormal we'll contact patient.   # Recommend the patient call us if she notices her left cervical lymph node enlarging in size/causing pain- would recommend palliative radiation. She and her son agree.   # I left a message for patient's PCP, Ms Eulogio Bear NP- to discuss the above plan.  # For now I recommend follow up with PCP no further follow-ups unless as needed. Patient family in agreement.  # I reviewed the blood work- with the patient in detail; also reviewed the imaging independently [as summarized above]; and with the patient in detail.   # 25 minutes face-to-face with the patient discussing the above plan of care; more than 50% of time spent on prognosis/ natural history; counseling and coordination.

## 2016-07-15 NOTE — Progress Notes (Signed)
[facility] CONSULT NOTE  Patient Care Team: [facility] as PCP - General  CHIEF COMPLAINTS/PURPOSE OF CONSULTATION: Lung cancer   #  Oncology History   # LEFT SUPRACLAV LN FNA- ADENO CA s/o Primary Lung ca; EGFR-NEG/PDL-1 [cannot be done on FNA]; LEFT LOWER LOBE LUNG nodule [ upto ~2cm]  # Hx of ? Left adrenal nodule  # COPD/Severe RA     Bronchogenic lung cancer, unspecified laterality (HCC)   07/08/2016 Initial Diagnosis    Bronchogenic lung cancer, unspecified laterality (HCC)      Malignant neoplasm of lower lobe of left lung (HCC)   07/15/2016 Initial Diagnosis    Malignant neoplasm of lower lobe of left lung (HCC)       HISTORY OF PRESENTING ILLNESS:  Melinda Cobb 82 y.o.  female with long-standing history of smoking severe rheumatoid arthritis; COPD and newly diagnosed lung cancer based on biopsy of a left neck nodule is here for follow-up.  Patient continues to feel poorly. Chronic shortness of breath chronic cough. Not any worse. Chronic back pain/ chronic rheumatoid arthritis. She is in a wheelchair. She is awaiting to have an MRI of the brain in with a week.  ROS: A complete 10 point review of system is done which is negative except mentioned above in history of present illness  MEDICAL HISTORY:  Past Medical History:  Diagnosis Date  . Allergy   . Anemia   . Arthritis   . Cataract   . COPD (chronic obstructive pulmonary disease) (HCC)   . GERD (gastroesophageal reflux disease)   . Hx of bladder infections    per pt  . Hypertension   . Lung cancer (HCC)   . Neck mass   . Nosebleed    from Eliquis  . Pulse irregularity    per pt   . Seropositive rheumatoid arthritis of multiple sites [facility])     SURGICAL HISTORY: Past Surgical History:  Procedure Laterality Date  . ABDOMINAL HYSTERECTOMY    . APPENDECTOMY    . HIP SURGERY     x 5 surgeries per pt  . 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  Social History   Social History  . Marital status: Single    Spouse name: N/A  . Number of children: N/A  . Years of education: N/A   Occupational History  . Not on file.   Social History Main Topics  . Smoking status: Current Every Day Smoker    Packs/day: 1.00    Years: 69.00    Types: Cigarettes  . Smokeless tobacco: Never Used  . Alcohol use No  . Drug use: No  . Sexual activity: Not on file   Other Topics Concern  . Not on file   Social History Narrative  . No narrative on file    FAMILY HISTORY: Family History  Problem Relation Age of Onset  . Hypertension Mother   . Rectal cancer Mother   . Cervical cancer Mother   . Hypertension Father   . Coronary artery disease Father   . Coronary artery disease Sister     ALLERGIES:  is allergic to infliximab; pantoprazole; doxycycline; azithromycin; hydrocodone; metronidazole; penicillins; and tolterodine.  MEDICATIONS:  Current Outpatient Prescriptions  Medication Sig Dispense Refill  . acetaminophen (TYLENOL) 500 MG tablet Take 1,000 mg by mouth every 6 (six) hours as needed  for moderate pain.    Marland Kitchen AMLODIPINE BESYLATE PO Take 2.5 mg by mouth daily.     Marland Kitchen aspirin 81 MG chewable tablet Chew 81 mg by mouth daily.     Marland Kitchen atenolol (TENORMIN) 25 MG tablet Take 25 mg by mouth daily as needed.     Marland Kitchen DIGOX 125 MCG tablet Take 0.125 mg by mouth daily.     . famotidine (PEPCID) 20 MG tablet Take 20 mg by mouth once.    . Probiotic Product (TRUBIOTICS PO) Take by mouth.    . simethicone (MYLICON) 338 MG chewable tablet Chew 125 mg by mouth every 6 (six) hours as needed for flatulence.    . sulfamethoxazole-trimethoprim (BACTRIM DS,SEPTRA DS) 800-160 MG tablet Take 1 tablet by mouth daily.     No current facility-administered medications for this visit.       Marland Kitchen  PHYSICAL EXAMINATION: ECOG PERFORMANCE STATUS: 3 - Symptomatic, >50% confined to bed  Vitals:    07/15/16 1354  BP: 128/74  Pulse: 67  Temp: 98.3 F (36.8 C)   Filed Weights   07/15/16 1354  Weight: 84 lb 7 oz (38.3 kg)    GENERAL:  Cachectic; in wheel chair.  Alert, no distress and comfortable.   Accompanied by son.  EYES: no pallor or icterus OROPHARYNX: no thrush or ulceration; poor dentition NECK: supple, ~1-2 cm mobile hard mass felft in left neck.  LYMPH:  no palpable lymphadenopathy in the cervical, axillary or inguinal regions LUNGS: clear to auscultation and  No wheeze or crackles HEART/CVS: regular rate & rhythm and no murmurs; No lower extremity edema ABDOMEN: abdomen soft, non-tender and normal bowel sounds Musculoskeletal:no cyanosis of digits and no clubbing; positive for rheumatoid changes.  PSYCH: alert & oriented x 3 with fluent speech NEURO: no focal motor/sensory deficits SKIN:  no rashes or significant lesions  LABORATORY DATA:  I have reviewed the data as listed Lab Results  Component Value Date   WBC 5.7 07/08/2016   HGB 15.5 07/08/2016   HCT 46.5 07/08/2016   MCV 91.5 07/08/2016   PLT 341 07/08/2016    Recent Labs  07/08/16 1600  NA 133*  K 4.3  CL 105  CO2 24  GLUCOSE 98  BUN 21*  CREATININE 0.79  CALCIUM 9.3  GFRNONAA >60  GFRAA >60  PROT 7.7  ALBUMIN 4.1  AST 20  ALT 12*  ALKPHOS 100  BILITOT 0.6    IMPRESSION: Left level 5B peripherally enhancing lymph node measuring 1.1 x 0.8 cm, likely correlating to previously described lymph node from prior ultrasound report, which was noted to be approximately 8 cm below the left ear. Additional left infraclavicular lymphadenopathy is noted. Together, these findings are worrisome for malignancy.  --------------------------------------------------------------------------------------------------------------------------------------------      RADIOGRAPHIC STUDIES: I have personally reviewed the radiological images as listed and agreed with the findings in the report. Ct Chest W  Contrast  Result Date: 07/11/2016 CLINICAL DATA:  Bronchogenic lung cancer.  Followup. EXAM: CT CHEST WITH CONTRAST TECHNIQUE: Multidetector CT imaging of the chest was performed during intravenous contrast administration. CONTRAST:  75 cc of Isovue-300 COMPARISON:  None FINDINGS: Cardiovascular: Normal heart size. No pericardial effusion. Aortic atherosclerosis noted. Calcification within the RCA, LAD coronary artery is noted. Mediastinum/Nodes: The trachea appears patent and is midline. Normal appearance of the esophagus. No mediastinal or hilar adenopathy. No axillary or supraclavicular adenopathy. Lungs/Pleura: There is advanced changes of centrilobular and paraseptal emphysema. Diffuse bronchial wall thickening noted. No pleural fluid identified.  There is a subpleural lesion overlying the posteromedial left lower lobe measuring 2.2 x 1.3 by 1.6 cm, image 91 of series 3 and image 73 of series 602. There is a scar like density scratch a second lesion within the left lower lobe measures 1.3 x 1.4 by 2.6 cm, image 75 of series 3 and image number 78 of series 602. Small left lower lobe spiculated nodule measures 7 mm, image 84 of series 3. Within the posteromedial left lower lobe there is a 5 mm nodule, image 97 of series 3. Scar like density is identified in the right middle lobe, image number 118 of series 3. There is pleural-parenchymal scarring within the right base. Upper Abdomen: Indeterminate nodule in the left adrenal gland measures 1.9 by 1.5 cm, image 56 of series 2. Right adrenal gland appears normal. No suspicious liver abnormality. Indeterminate high attenuation structure arising from the posterior cortex of the left kidney measures 1.2 cm, image 59 of series 2. Musculoskeletal: The bones appear diffusely osteopenic. No aggressive lytic or sclerotic bone lesions identified. T12 compression fracture has been treated with bone cement. The T8 compression fractures also been treated with bone cement.  Compression deformities are noted at T7 and T9, untreated. IMPRESSION: 1. Several indeterminate nodular densities within the left lower lobe are identified and may represent clinically suspected bronchogenic carcinoma. Further investigation with PET-CT and tissue sampling is advised. 2. Indeterminate left adrenal nodule. Attention on PET-CT is recommended. 3. There is a hyperdense lesion arising from the posterior aspect of the left kidney. This may represent a solid renal neoplasm or hemorrhagic/proteinaceous renal cyst. A more definitive assessment of this nodule may be obtained with a renal mass protocol CT or MRI. 4. Advanced smoking related changes throughout both lungs 5. Aortic atherosclerosis and 3 vessel coronary artery calcifications. Electronically Signed   By: Signa Kell M.D.   On: 07/11/2016 14:50    ASSESSMENT & PLAN:   Malignant neoplasm of lower lobe of left lung (HCC) # Adenocarcinoma- likely lung primary based on biopsy of left neck soft tissue mass. Stage IV. CT shows left lower lung nodules highly suspicious for malignancy; also left adrenal nodule. EGFR- Wild; Not mutated; PDL-1 testing cannot be performed on FNA [patient not interested in biopsy].  # Discussed the treatments are palliative. Given the fraility; multiple medical problems- patient is a poor candidate for chemotherapy. Patient not candidate for any anti-EGFR therapy. Not a good candidate for immunotherapy given her severe rheumatoid arthritis. In general patient is also not taking any treatments citing multiple medical issues/poor tolerance to medications etc.  # Awaiting MRI of the brain- IF abnormal we'll contact patient.   # Recommend the patient call us if she notices her left cervical lymph node enlarging in size/causing pain- would recommend palliative radiation. She and her son agree.   # I left a message for patient's PCP, Ms Doristine Mango NP- to discuss the above plan.  # For now I recommend follow up  with PCP no further follow-ups unless as needed. Patient family in agreement.  # I reviewed the blood work- with the patient in detail; also reviewed the imaging independently [as summarized above]; and with the patient in detail.   # 25 minutes face-to-face with the patient discussing the above plan of care; more than 50% of time spent on prognosis/ natural history; counseling and coordination.  All questions were answered. The patient knows to call the clinic with any problems, questions or concerns.     Stefano Gaul  Ann Lions, MD 07/15/2016 4:00 PM

## 2016-07-18 ENCOUNTER — Ambulatory Visit
Admission: RE | Admit: 2016-07-18 | Discharge: 2016-07-18 | Disposition: A | Discharge status: Home / Self Care | Payer: Medicare Other | Source: Ambulatory Visit | Attending: Internal Medicine | Admitting: Internal Medicine

## 2016-07-18 DIAGNOSIS — C3490 Malignant neoplasm of unspecified part of unspecified bronchus or lung: Secondary | ICD-10-CM | POA: Insufficient documentation

## 2016-07-18 MED ORDER — GADOBENATE DIMEGLUMINE 529 MG/ML IV SOLN
7.0000 mL | Freq: Once | INTRAVENOUS | Status: AC | PRN
  Administered 2016-07-18: 7 mL via INTRAVENOUS

## 2016-07-21 ENCOUNTER — Telehealth: Payer: Self-pay | Admitting: *Deleted

## 2016-07-21 NOTE — Telephone Encounter (Signed)
-----   Message from Cammie Sickle, MD sent at 07/18/2016  4:19 PM EST ----- Please inform patient- that MRI of the brain is negative for cancer. Recommend follow up with PCP as discussed previously.

## 2016-07-21 NOTE — Telephone Encounter (Signed)
Contacted patient. Discussed brain mri with patient. Reassured pt results did not demonstrate any signs of care. She was asked to f/u with pcp as previously discused by Dr. Yevette Edwards. She thanked me for calling her with the results.

## 2018-05-16 IMAGING — US US SOFT TISSUE HEAD/NECK
1 series · 14 of 15 positions shown · non-contrast
Comparison: None.

CLINICAL DATA: Mass on left side of neck

EXAM:
ULTRASOUND OF HEAD/NECK SOFT TISSUES
TECHNIQUE: Ultrasound examination of the head and neck soft tissues was
performed in the area of clinical concern.

[Series 1: us soft tissue head/neck · 0.06mm/px · 14 of 15 slices shown]
[im 1/15]
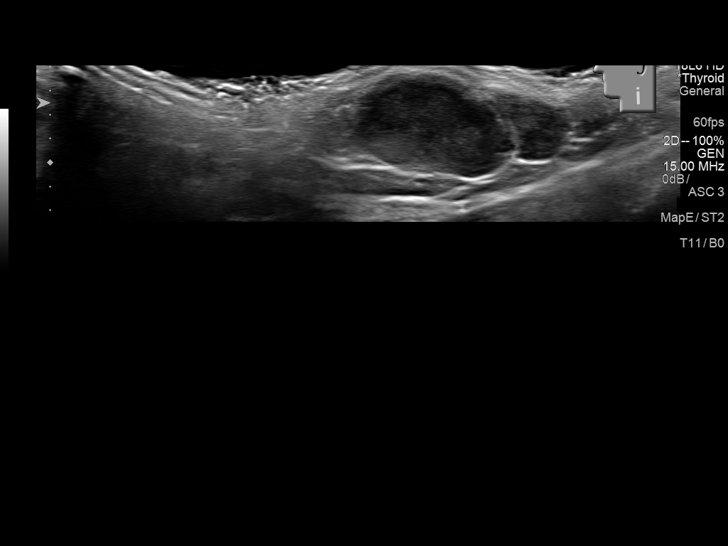
[im 2/15]
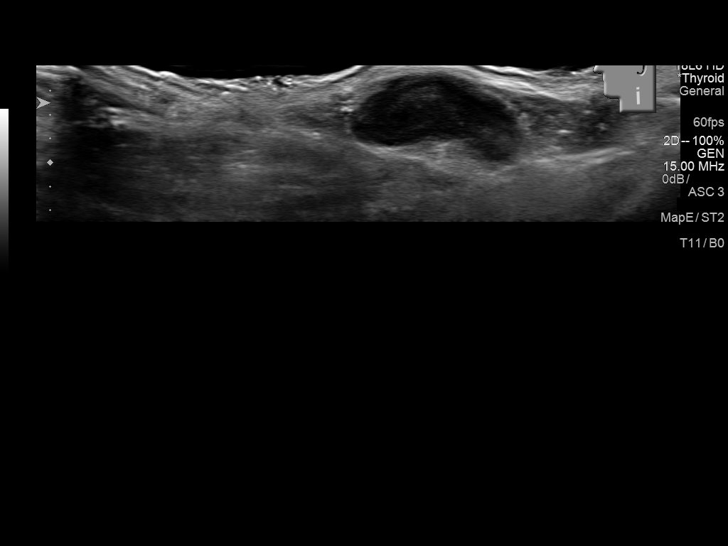
[im 3/15]
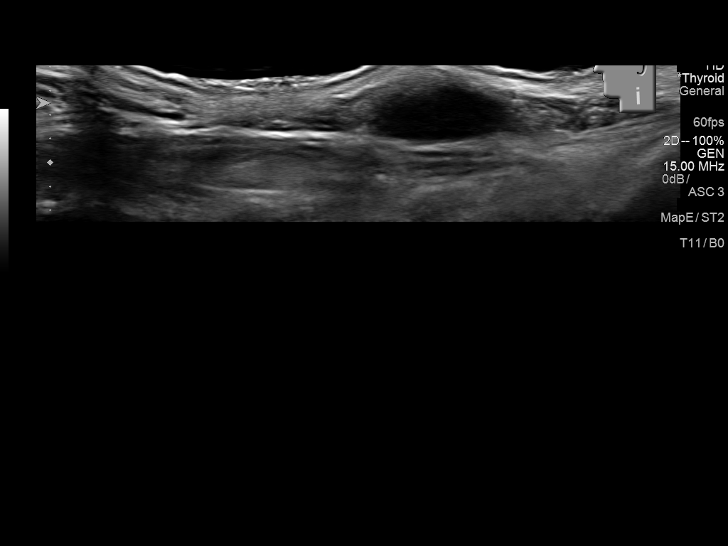
[im 4/15]
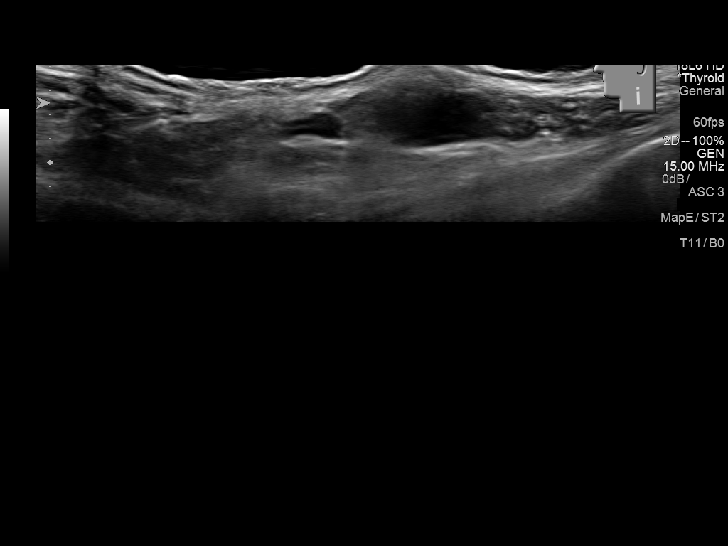
[im 5/15]
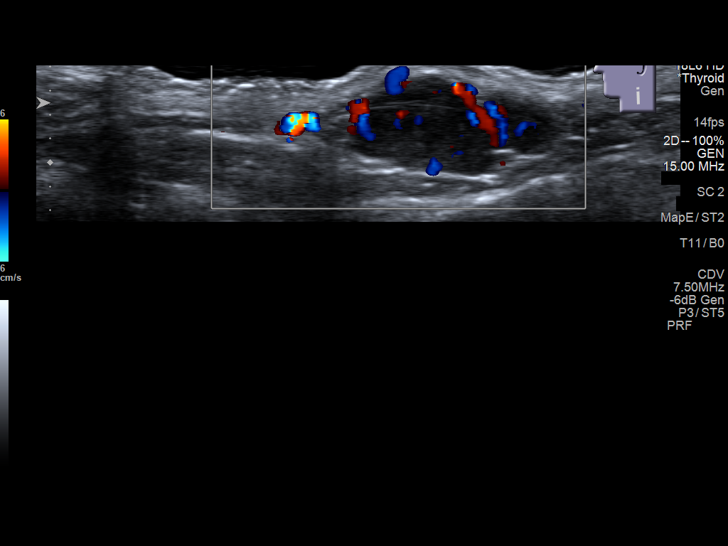
[im 6/15]
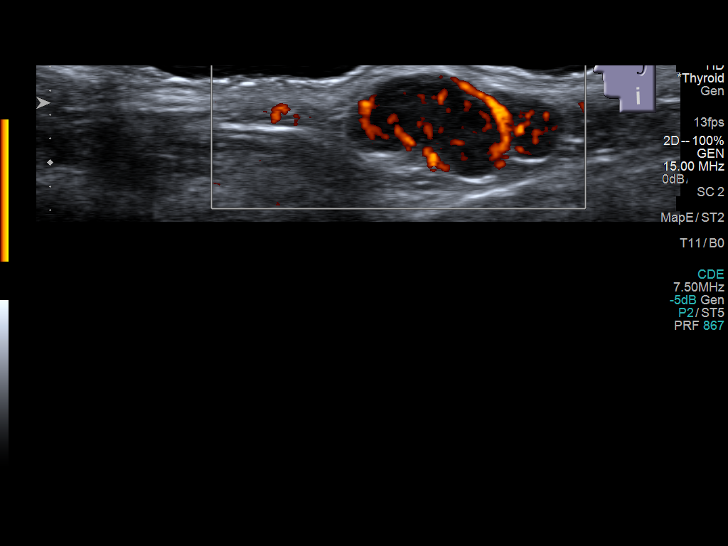
[im 7/15]
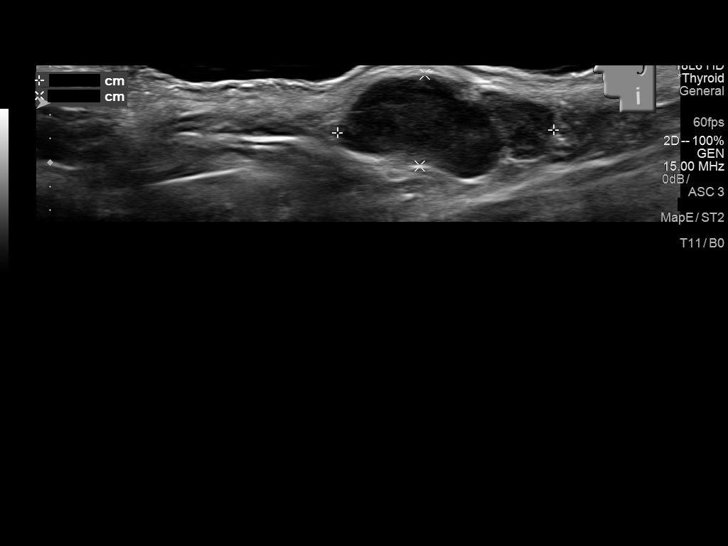
[im 9/15]
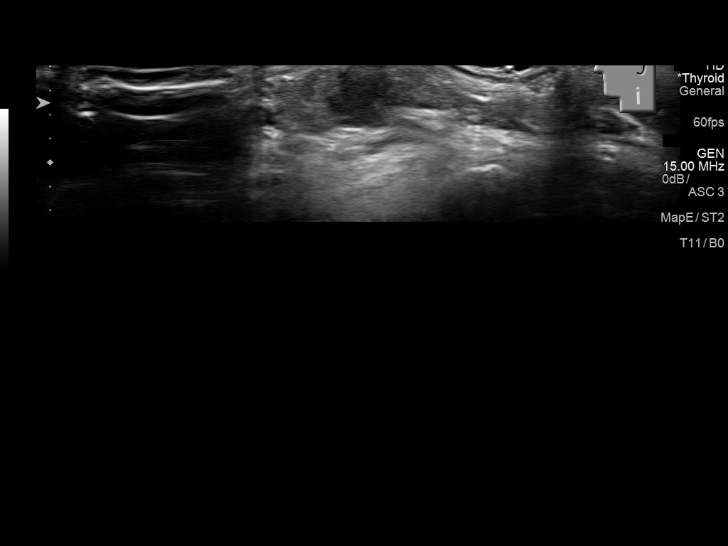
[im 10/15]
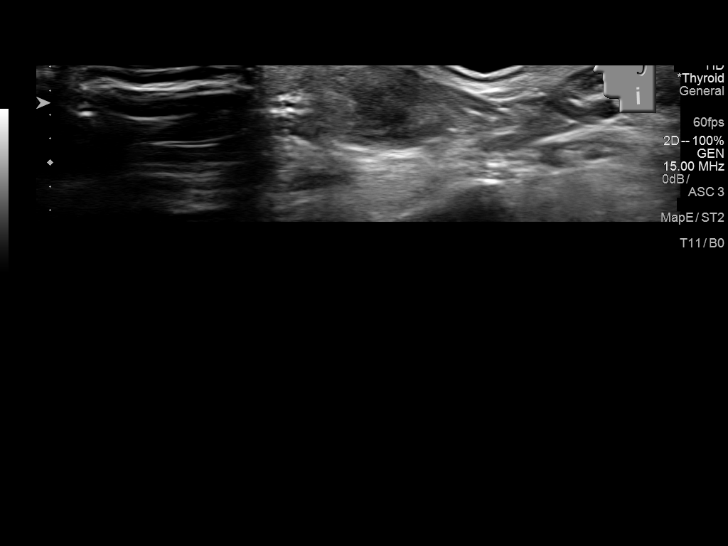
[im 11/15]
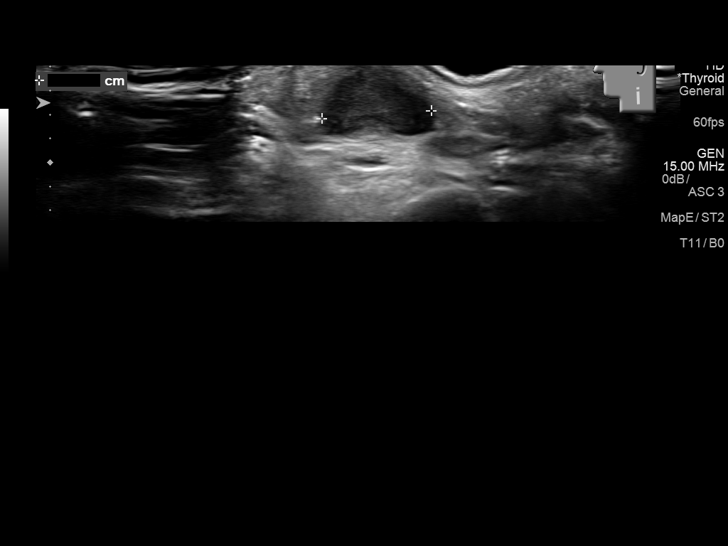
[im 12/15]
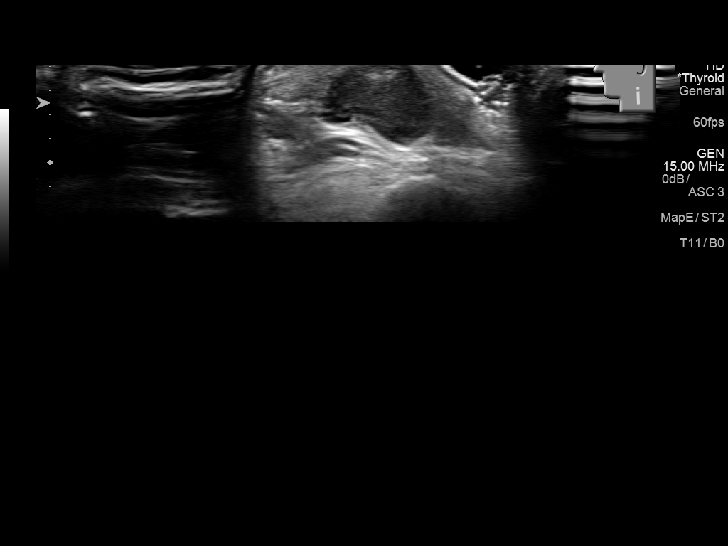
[im 13/15]
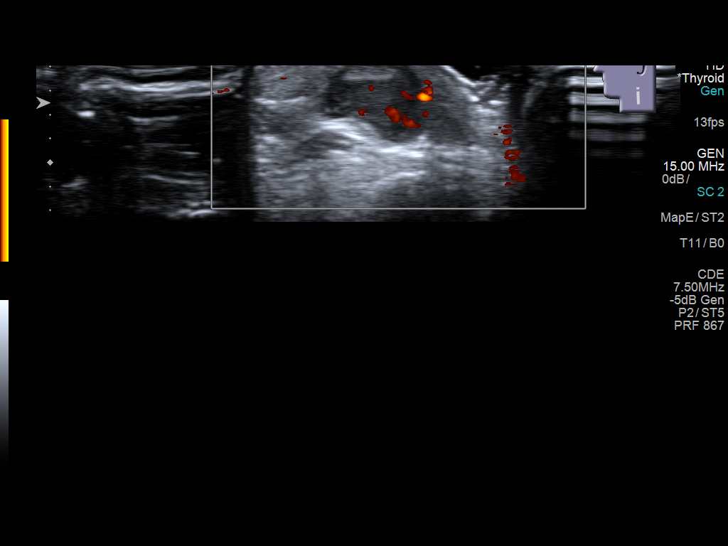
[im 14/15]
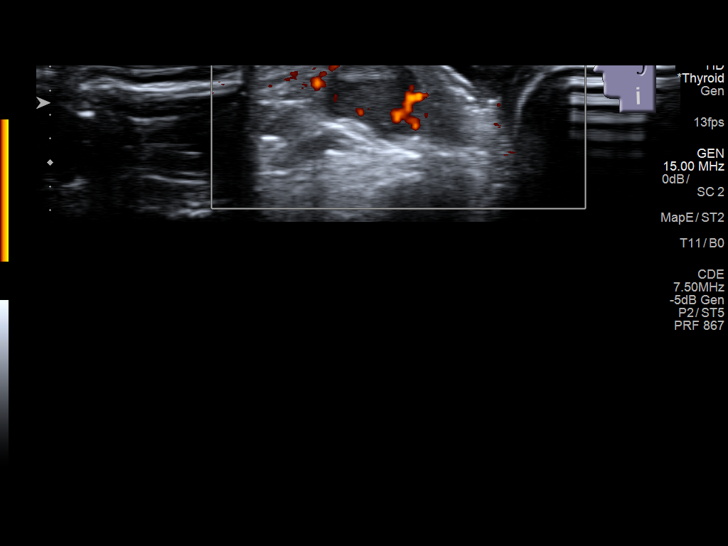
[im 15/15]
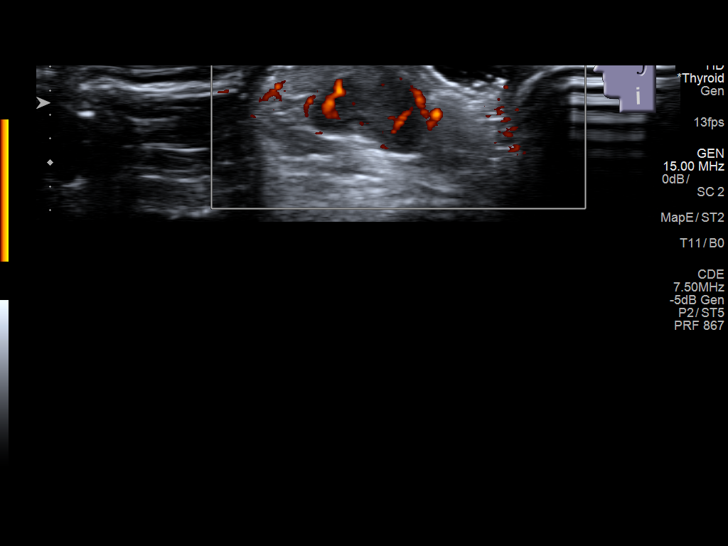

[14 of 15 positions shown; findings below may reference images not displayed]

FINDINGS: In the region of palpable mass in the left neck approximately 8 cm
inferior to the ear is a superficial solid mass, this measures she
1.8 x 0.8 x 0.9 cm. The mass is hypervascular. No fluid collections.
IMPRESSION: Solid 1.8 cm mass corresponding to the left neck palpable mass.
Findings could relate to an enlarged lymph node, no fatty hilus
seen, or other soft tissue mass. Further management will need to be
based on clinical grounds, FNA may be considered.

## 2018-09-01 ENCOUNTER — Other Ambulatory Visit: Payer: Self-pay | Admitting: Family Medicine

## 2018-09-01 DIAGNOSIS — G8929 Other chronic pain: Secondary | ICD-10-CM

## 2018-09-01 DIAGNOSIS — R1013 Epigastric pain: Principal | ICD-10-CM

## 2018-09-06 ENCOUNTER — Ambulatory Visit
Admission: RE | Admit: 2018-09-06 | Discharge: 2018-09-06 | Disposition: A | Discharge status: Home / Self Care | Payer: Medicare Other | Source: Ambulatory Visit | Attending: Family Medicine | Admitting: Family Medicine

## 2018-09-06 DIAGNOSIS — R1013 Epigastric pain: Secondary | ICD-10-CM | POA: Insufficient documentation

## 2018-09-06 DIAGNOSIS — G8929 Other chronic pain: Secondary | ICD-10-CM | POA: Diagnosis present

## 2018-09-06 MED ORDER — IOPAMIDOL (ISOVUE-300) INJECTION 61%
75.0000 mL | Freq: Once | INTRAVENOUS | Status: AC | PRN
  Administered 2018-09-06: 75 mL via INTRAVENOUS

## 2018-09-15 ENCOUNTER — Encounter: Payer: Self-pay | Admitting: Hematology and Oncology

## 2018-09-15 ENCOUNTER — Inpatient Hospital Stay: Attending: Hematology and Oncology | Admitting: Hematology and Oncology

## 2018-09-15 VITALS — BP 98/61 | HR 67 | Temp 97.7°F | Resp 18 | Ht 60.0 in | Wt 75.0 lb

## 2018-09-15 DIAGNOSIS — R110 Nausea: Secondary | ICD-10-CM

## 2018-09-15 DIAGNOSIS — R109 Unspecified abdominal pain: Secondary | ICD-10-CM | POA: Insufficient documentation

## 2018-09-15 DIAGNOSIS — F17210 Nicotine dependence, cigarettes, uncomplicated: Secondary | ICD-10-CM | POA: Diagnosis not present

## 2018-09-15 DIAGNOSIS — Z800 Family history of malignant neoplasm of digestive organs: Secondary | ICD-10-CM | POA: Insufficient documentation

## 2018-09-15 DIAGNOSIS — G893 Neoplasm related pain (acute) (chronic): Secondary | ICD-10-CM | POA: Diagnosis not present

## 2018-09-15 DIAGNOSIS — C801 Malignant (primary) neoplasm, unspecified: Secondary | ICD-10-CM | POA: Diagnosis not present

## 2018-09-15 DIAGNOSIS — Z7189 Other specified counseling: Secondary | ICD-10-CM

## 2018-09-15 DIAGNOSIS — Z66 Do not resuscitate: Secondary | ICD-10-CM

## 2018-09-15 DIAGNOSIS — Z79899 Other long term (current) drug therapy: Secondary | ICD-10-CM | POA: Diagnosis not present

## 2018-09-15 DIAGNOSIS — R634 Abnormal weight loss: Secondary | ICD-10-CM | POA: Diagnosis not present

## 2018-09-15 DIAGNOSIS — C3432 Malignant neoplasm of lower lobe, left bronchus or lung: Secondary | ICD-10-CM

## 2018-09-15 DIAGNOSIS — I4891 Unspecified atrial fibrillation: Secondary | ICD-10-CM | POA: Insufficient documentation

## 2018-09-15 DIAGNOSIS — Z7982 Long term (current) use of aspirin: Secondary | ICD-10-CM | POA: Diagnosis not present

## 2018-09-15 DIAGNOSIS — R531 Weakness: Secondary | ICD-10-CM | POA: Insufficient documentation

## 2018-09-15 DIAGNOSIS — K219 Gastro-esophageal reflux disease without esophagitis: Secondary | ICD-10-CM | POA: Insufficient documentation

## 2018-09-15 DIAGNOSIS — J449 Chronic obstructive pulmonary disease, unspecified: Secondary | ICD-10-CM | POA: Diagnosis not present

## 2018-09-15 DIAGNOSIS — K8689 Other specified diseases of pancreas: Secondary | ICD-10-CM

## 2018-09-15 DIAGNOSIS — Z8249 Family history of ischemic heart disease and other diseases of the circulatory system: Secondary | ICD-10-CM

## 2018-09-15 DIAGNOSIS — M549 Dorsalgia, unspecified: Secondary | ICD-10-CM | POA: Diagnosis not present

## 2018-09-15 DIAGNOSIS — R221 Localized swelling, mass and lump, neck: Secondary | ICD-10-CM | POA: Insufficient documentation

## 2018-09-15 DIAGNOSIS — I10 Essential (primary) hypertension: Secondary | ICD-10-CM | POA: Diagnosis not present

## 2018-09-15 NOTE — Progress Notes (Signed)
[facility]     [address], [address]     [place], [place] [address]     Phone: [phone]      Fax: [phone]        Clinic day:  09/15/2018  Chief Complaint: Melinda Cobb is a 85 y.o. female with metastatic adenocarcinoma of likely lung origin who is seen for new patient assessment.  HPI:  The patient has a 65 pack year smoking history.  She presented with a left neck mass.  Neck CT on 06/16/2016 revealed a 1.1 x 0.8 cm left level 5B peripherally enhancing lymph node likely correlating to previously described lymph node from prior ultrasound, which was noted to be approximately 8 cm below the left ear.  In addition, there was left infraclavicular lymphadenopathy.  Fine needle aspirate of the left supraclavicular node on 06/16/2016 revealed metastatic adenocarcinoma, TTF1 positive.  She was seen on 07/03/2016 at [facility] by Dr. Nori Riis Ready on 07/03/2016.  She was initially seen by Dr. Rogue Bussing on 07/08/2016.  She noted a 2 month history of a lump in her neck.  She denied any pain except for chronic back pain.  She noted chronic shortness of breath from her long-standing COPD.  She was not on home oxygen.   She had a history of atrial fibrillation- not on anticoagulation.  She described intermittent headaches.  She was wheelchair bound because of multiple orthopedic surgeries/left hip.  She has an IT trainer wheelchair at home.   She noted a poor appetite and a recent history of C. difficile weight loss.   Given her frailty and multiple medical problems, she was felt to be a poor candidate for chemotherapy.  Testing for EGFR and PDL-1 were considered.  She was felt to be at increased risk of rheumatoid arthritis symptoms with PDL-1 therapy.  Chest CT on 07/11/2016 revealed several indeterminate nodular densities within the left lower lobe and may represent clinically suspected bronchogenic carcinoma. Further investigation with PET-CT and tissue sampling was advised.   There was an indeterminate left adrenal nodule. Attention on PET-CT was recommended.  There was a hyperdense lesion arising from the posterior aspect of the left kidney. This may represent a solid renal neoplasm or hemorrhagic/proteinaceous renal cyst. A more definitive assessment of this nodule may be obtained with a renal mass protocol CT or MRI.  There was advanced smoking related changes throughout both lungs  Head MRI on 07/11/2016 revealed no evidence of metastatic disease.  Abdomen and pelvic CT on 09/06/2018 revealed marked dilatation of the pancreatic duct at the level of the tail of pancreas. There was a 2.3 x 1.5 x 1.4 cm obstructing mass in the body of pancreas concerning for pancreas adenocarcinoma. This could be further assessed with contrast enhanced pancreas protocol MRI.  There were multiple new peripherally enhancing liver lesions identified concerning for metastatic disease.  There was a partially visualized subpleural 3.2 cm mass within the left lower lobe which had increased in size when compared with 07/11/2016 and may reflect patient's known lung cancer.  Symptomatically, she feels "miserable" all of the time. She complains of constant abdominal pain for the last 2 months. She has generalized weakness. She has constant nausea with no vomiting. Patient is not eating well. Weight today is 75 lb (34 kg). Patient denies shortness of breath or episodes of chest pain. She notes continuous vertigo symptoms over the course of the last few days. Dizziness is associated mostly with position changes. Patient has been chronically  constipated for many years, which has resulted in a prolapsed rectum.   She notes chronic recurrent MRSA infections.  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 Patient has been unable to ambulate for at least 6 years. She spends the majority of her time "sitting and reading".  Patient requires assistance with the majority  of her ADLs.  She lives alone, but has a [employer] fast streamer for 2 hours x 5 days a week. Her arthritic pain is "horrible" secondary to both RA and OA. She has been seen in the past by rheumatology, however due to her recurrent MRSA, she has been unable to safely utilize any form of biological medications.   Patient has been on Hospice for 2 weeks.  She does not desire any treatment for her disease at this point. She states, "I have had it. Take me out and shoot me. I don't want anything else. I am 89 and I just don't want to continue to do all of this anymore".  She does not have a legally define HCPOA at this time. Patient confirms CODE status as being DNR/DNI. Son notes that patient "opts" not to take pain medications because she does not like the way that they make her feel.   Patient complains of pain rated 5/10 in the clinic today. She continues to smoke at least 1 pack of cigarettes of day.    Past Medical History:  Diagnosis Date  . Allergy   . Anemia   . Arthritis   . Cataract   . COPD (chronic obstructive pulmonary disease) ([place])   . GERD (gastroesophageal reflux disease)   . Hx of bladder infections    per pt  . Hypertension   . Lung cancer ([place])   . Neck mass   . Nosebleed    from Eliquis  . Pulse irregularity    per pt   . Seropositive rheumatoid arthritis of multiple sites [facility])     Past Surgical History:  Procedure Laterality Date  . ABDOMINAL HYSTERECTOMY    . APPENDECTOMY    . HIP SURGERY     x 5 surgeries per pt  . NECK LESION BIOPSY     per pt    Family History  Problem Relation Age of Onset  . Hypertension Mother   . Rectal cancer Mother   . Cervical cancer Mother   . Hypertension Father   . Coronary artery disease Father   . Coronary artery disease Sister     Social History:  reports that she has been smoking cigarettes. She has a 69.00 pack-year smoking history. She has never used smokeless tobacco. She reports that she does not drink alcohol or  use drugs.  She smokes 1 ppd.  She is a retired from Veterinary surgeon work.  She has 5 children.  She lives in [place].  The patient is accompanied by son, Lupita Raider, today.  Allergies:  Allergies  Allergen Reactions  . Infliximab Anaphylaxis  . Pantoprazole Diarrhea and Swelling  . Doxycycline     Other reaction(s): Other (See Comments) Burning sensation in arms despite no exposure to the sun.  Pt did not tolerate this sensation well.  . Azithromycin     Other reaction(s): Unknown  . Hydrocodone     Other reaction(s): Hallucination  . Metronidazole Nausea Only  . Penicillins Itching  . Tolterodine Itching    Current Medications: Current Outpatient Medications  Medication Sig Dispense Refill  . acetaminophen (TYLENOL)  500 MG tablet Take 1,000 mg by mouth every 6 (six) hours as needed for moderate pain.    Marland Kitchen AMLODIPINE BESYLATE PO Take 5 mg by mouth daily.     Marland Kitchen atenolol (TENORMIN) 25 MG tablet Take 25 mg by mouth daily as needed.     . famotidine (PEPCID) 20 MG tablet Take 20 mg by mouth once.    . sulfamethoxazole-trimethoprim (BACTRIM DS,SEPTRA DS) 800-160 MG tablet Take 1 tablet by mouth daily.    Marland Kitchen aspirin 81 MG chewable tablet Chew 81 mg by mouth daily.     Marland Kitchen DIGOX 125 MCG tablet Take 0.125 mg by mouth daily.     . Probiotic Product (TRUBIOTICS PO) Take by mouth.    . simethicone (MYLICON) 517 MG chewable tablet Chew 125 mg by mouth every 6 (six) hours as needed for flatulence.     No current facility-administered medications for this visit.     Review of Systems:  GENERAL:  Feels "miserable all of the time".  No fevers, sweats.  Weight loss of 10 pounds. PERFORMANCE STATUS (ECOG):  3 HEENT:  No visual changes, runny nose, sore throat, mouth sores or tenderness. Lungs: No shortness of breath or cough.  No hemoptysis. Cardiac:  No chest pain, palpitations, orthopnea, or PND. GI:  Abdominal pain x 2 months.  Not eating well.  Constant nausea.  Chronic constipation.  Once in  awhile diarrhea.  Prolapsed rectum.  No vomiting, melena or hematochezia. GU:  No urgency, frequency, dysuria, or hematuria. Musculoskeletal:  s/p left hip surgeries (MRGSA infection with removal of joint).  Unable to walk x 6 years.  Severe rheumatoid arthritis.  Joints "sore and hard to get around".  No muscle tenderness. Extremities:  No pain or swelling. Skin:  No rashes or skin changes. Neuro:  Dizziness with position changes (standing too fast).  Headache at times.  No focal numbness or weakness, balance or coordination issues. Endocrine:  No diabetes, thyroid issues, hot flashes or night sweats. Psych:  No mood changes, depression or anxiety.  Sleeping more. Pain:  Chronic pain in neck, joints, and abdomen (5 out of 10). Review of systems:  All other systems reviewed and found to be negative.  Physical Exam: Blood pressure 98/61, pulse 67, temperature 97.7 F (36.5 C), temperature source Tympanic, resp. rate 18, height 5' (1.524 m), weight 75 lb (34 kg), SpO2 91 %. GENERAL:  Thin elderly woman sitting comfortably in a wheelchair in the exam room in no acute distress. MENTAL STATUS:  Alert and oriented to person, place and time. HEAD:  Brown shoulder length hair.  Temporal wasting.  Normocephalic, atraumatic, face symmetric, no Cushingoid features. EYES:  Brown eyes.  Pupils equal round and reactive to light and accomodation.  No conjunctivitis or scleral icterus. ENT:  Oropharynx clear without lesion.  Tongue normal.  Upper dentures.  Mucous membranes moist.  RESPIRATORY:  Clear to auscultation without rales, wheezes or rhonchi. CARDIOVASCULAR:  Regular rate and rhythm without murmur, rub or gallop. ABDOMEN:  Soft, non-tender, with active bowel sounds, and no hepatosplenomegaly.  No masses. SKIN:  No rashes, ulcers or lesions. EXTREMITIES: Thin legs.  Left leg turned in.  Hands with ulnar deviation.  No edema, no skin discoloration or tenderness.  No palpable cords. LYMPH NODES:   Palpable left cervical adenopathy.  No palpable supraclavicular, axillary or inguinal adenopathy  NEUROLOGICAL: Unremarkable. PSYCH:  Appropriate.   No visits with results within 3 Day(s) from this visit.  Latest known visit with results  is:  Orders Only on 07/08/2016  Component Date Value Ref Range Status  . WBC 07/08/2016 5.7  3.6 - 11.0 K/uL Final  . RBC 07/08/2016 5.08  3.80 - 5.20 MIL/uL Final  . Hemoglobin 07/08/2016 15.5  12.0 - 16.0 g/dL Final  . HCT 07/08/2016 46.5  35.0 - 47.0 % Final  . MCV 07/08/2016 91.5  80.0 - 100.0 fL Final  . MCH 07/08/2016 30.5  26.0 - 34.0 pg Final  . MCHC 07/08/2016 33.3  32.0 - 36.0 g/dL Final  . RDW 07/08/2016 15.3* 11.5 - 14.5 % Final  . Platelets 07/08/2016 341  150 - 440 K/uL Final  . Neutrophils Relative % 07/08/2016 61  % Final  . Neutro Abs 07/08/2016 3.5  1.4 - 6.5 K/uL Final  . Lymphocytes Relative 07/08/2016 20  % Final  . Lymphs Abs 07/08/2016 1.2  1.0 - 3.6 K/uL Final  . Monocytes Relative 07/08/2016 14  % Final  . Monocytes Absolute 07/08/2016 0.8  0.2 - 0.9 K/uL Final  . Eosinophils Relative 07/08/2016 4  % Final  . Eosinophils Absolute 07/08/2016 0.2  0 - 0.7 K/uL Final  . Basophils Relative 07/08/2016 1  % Final  . Basophils Absolute 07/08/2016 0.1  0 - 0.1 K/uL Final  . Sodium 07/08/2016 133* 135 - 145 mmol/L Final  . Potassium 07/08/2016 4.3  3.5 - 5.1 mmol/L Final  . Chloride 07/08/2016 105  101 - 111 mmol/L Final  . CO2 07/08/2016 24  22 - 32 mmol/L Final  . Glucose, Bld 07/08/2016 98  65 - 99 mg/dL Final  . BUN 07/08/2016 21* 6 - 20 mg/dL Final  . Creatinine, Ser 07/08/2016 0.79  0.44 - 1.00 mg/dL Final  . Calcium 07/08/2016 9.3  8.9 - 10.3 mg/dL Final  . Total Protein 07/08/2016 7.7  6.5 - 8.1 g/dL Final  . Albumin 07/08/2016 4.1  3.5 - 5.0 g/dL Final  . AST 07/08/2016 20  15 - 41 U/L Final  . ALT 07/08/2016 12* 14 - 54 U/L Final  . Alkaline Phosphatase 07/08/2016 100  38 - 126 U/L Final  . Total Bilirubin 07/08/2016  0.6  0.3 - 1.2 mg/dL Final  . GFR calc non Af Amer 07/08/2016 >60  >60 mL/min Final  . GFR calc Af Amer 07/08/2016 >60  >60 mL/min Final   Comment: (NOTE) The eGFR has been calculated using the CKD EPI equation. This calculation has not been validated in all clinical situations. eGFR's persistently <60 mL/min signify possible Chronic Kidney Disease.   . Anion gap 07/08/2016 4* 5 - 15 Final  . LDH 07/08/2016 134  98 - 192 U/L Final    Assessment:  Melinda Cobb is a 85 y.o. female with probable metastatic adenocarcinoma of the lung s/p fine needle aspirate of the left supraclavicular node on 06/16/2016.  Pathology revealed metastatic adenocarcinoma, TTF1 positive.  Chest CT on 07/11/2016 revealed several indeterminate nodular densities within the left lower lobe and may represent clinically suspected bronchogenic carcinoma.  There was an indeterminate left adrenal nodule.  There was a hyperdense lesion arising from the posterior aspect of the left kidney. This may represent a solid renal neoplasm or hemorrhagic/proteinaceous renal cyst.  There was advanced smoking related changes throughout both lungs  Head MRI on 07/11/2016 revealed no evidence of metastatic disease.  Abdomen and pelvic CT on 09/06/2018 revealed marked dilatation of the pancreatic duct at the level of the tail of pancreas. There was a 2.3 x 1.5 x 1.4 cm obstructing  mass in the body of pancreas concerning for pancreas adenocarcinoma. There were multiple new peripherally enhancing liver lesions identified concerning for metastatic disease.  There was a partially visualized subpleural 3.2 cm mass within the left lower lobe which had increased in size when compared with 07/11/2016 and may reflect patient's known lung cancer.  She has been on Hospice x 2 weeks.  Code status is DNR/DNI.  Symptomatically, she feels "miserable" all of the time.  She is weak and requires assistance with her ADLs.  She is eating poorly.  She has lost  weight.  She has abdominal pain and nausea.  She does not want to take pain medications.  Plan: 1.   Metastatic adenocarcinoma  Review entire medical history, diagnosis and management of metastatic adenocarcinoma.  Initial presentation and biopsy are consistent with lung primary.  Review imaging to date.  Unclear if lesion in pancreas represents a separate primary or metastatic disease.  Discuss patient's decision for palliative care/Hospice. 2.   Cancer-related pain  Discuss chronic pain.  Patient not interested in pain medications.  Discuss potential palliative radiation.  She is not interested. 3.   Code status  DNR/DNI discussed.  Discuss medical power of attorney.  Patient on Hospice. 4.   RTC prn.   Honor Loh, NP  09/15/2018, 12:04 PM   I saw and evaluated the patient, participating in the key portions of the service and reviewing pertinent diagnostic studies and records.  I reviewed the nurse practitioner's note and agree with the findings and the plan.  The assessment and plan were discussed with the patient.  Multiple questions were asked by the patient and answered.   Nolon Stalls, MD 09/15/2018,12:04 PM

## 2018-12-03 DEATH — deceased

## 2019-01-03 DIAGNOSIS — Z7189 Other specified counseling: Secondary | ICD-10-CM | POA: Insufficient documentation

## 2019-01-03 DIAGNOSIS — G893 Neoplasm related pain (acute) (chronic): Secondary | ICD-10-CM | POA: Insufficient documentation

## 2019-01-03 DIAGNOSIS — K8689 Other specified diseases of pancreas: Secondary | ICD-10-CM | POA: Insufficient documentation

## 2020-11-19 IMAGING — CT CT ABD-PELV W/ CM
1 of 3 series · 13 of 32 positions shown, 18 images · IV contrast (APPLIED)
Comparison: 07/11/2016

CLINICAL DATA: Chronic epigastric pain. Weight loss. History of
lung cancer.

EXAM:
CT ABDOMEN AND PELVIS WITH CONTRAST
TECHNIQUE: Multidetector CT imaging of the abdomen and pelvis was performed
using the standard protocol following bolus administration of
intravenous contrast.
CONTRAST:  75mL OFSGAW-5LL IOPAMIDOL (OFSGAW-5LL) INJECTION 61%

[Series 2: axial st · axial · 0.70mm/px · z∈[-718,-398]mm · 13 of 74 slices shown, 18 images]
[im 5/74  soft-tissue]
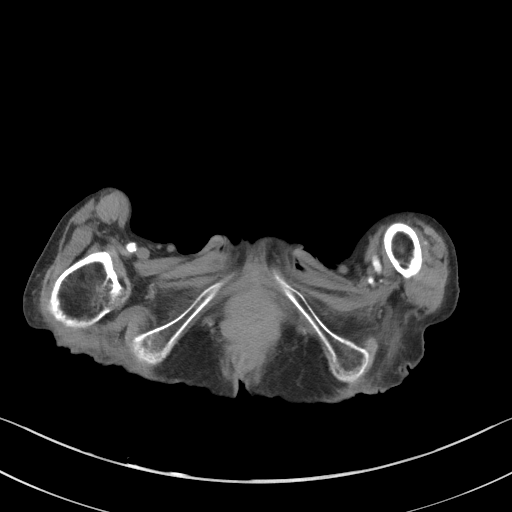
[im 5/74  bone]
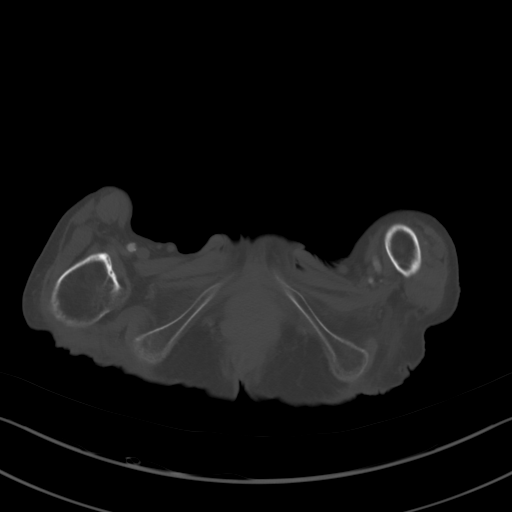
[im 13/74  soft-tissue]
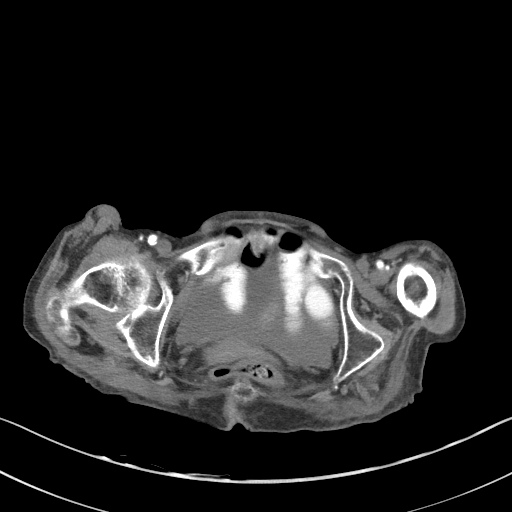
[im 18/74  soft-tissue]
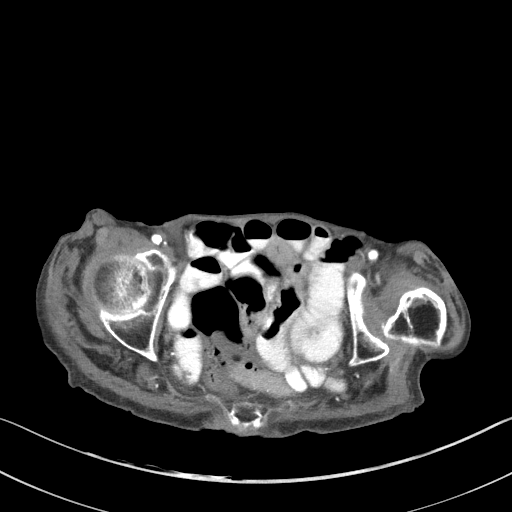
[im 22/74  soft-tissue]
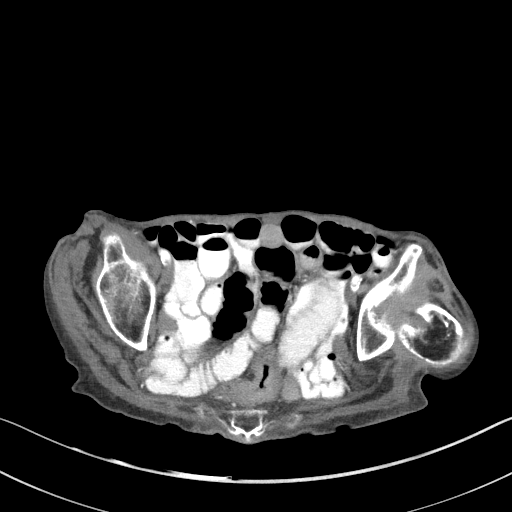
[im 31/74  soft-tissue]
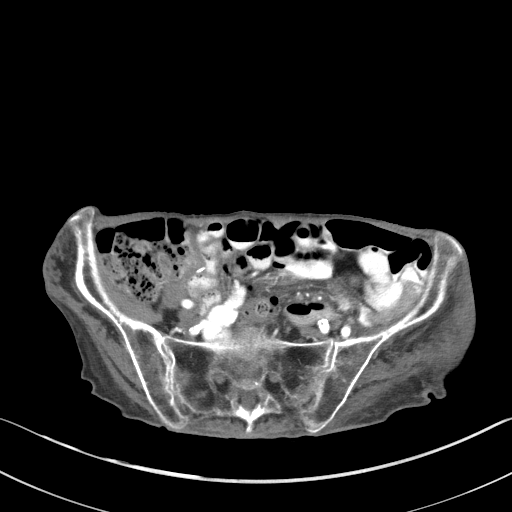
[im 35/74  soft-tissue]
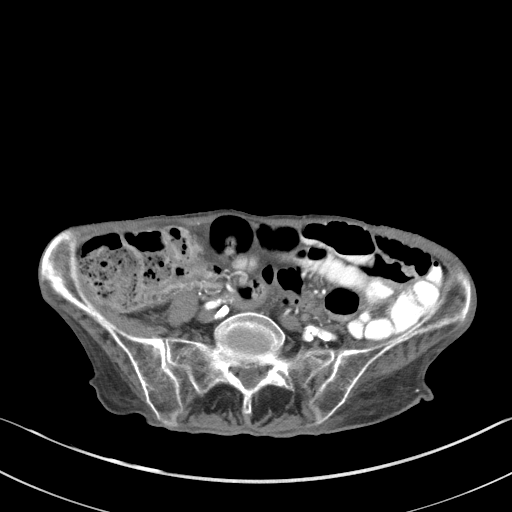
[im 39/74  soft-tissue]
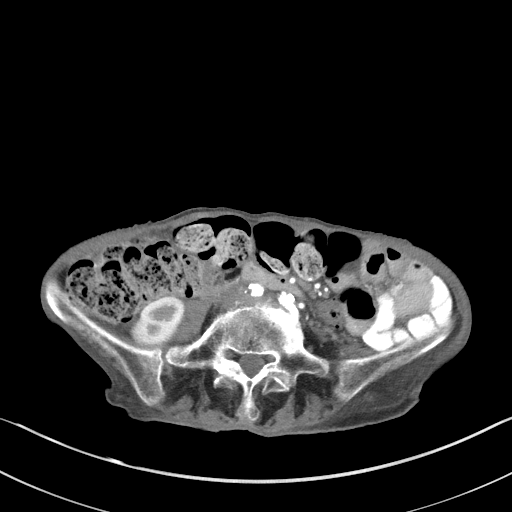
[im 48/74  soft-tissue]
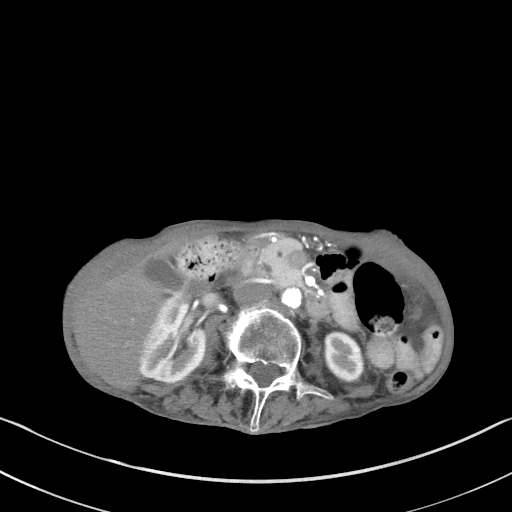
[im 52/74  soft-tissue]
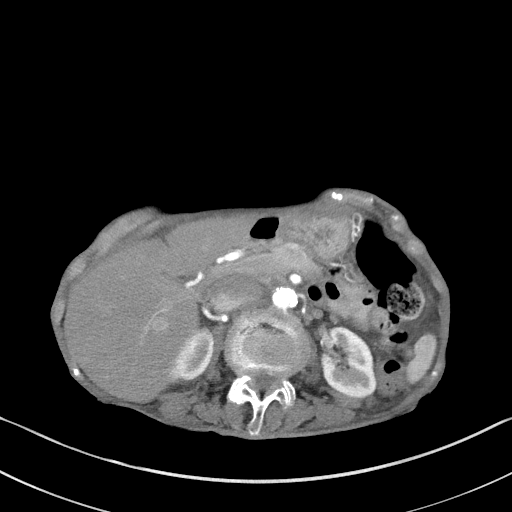
[im 52/74  bone]
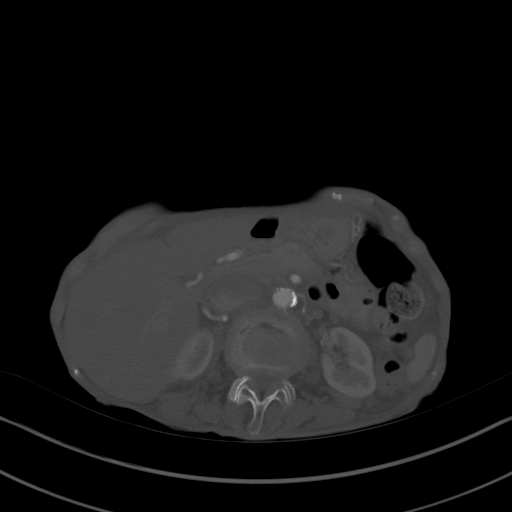
[im 56/74  soft-tissue]
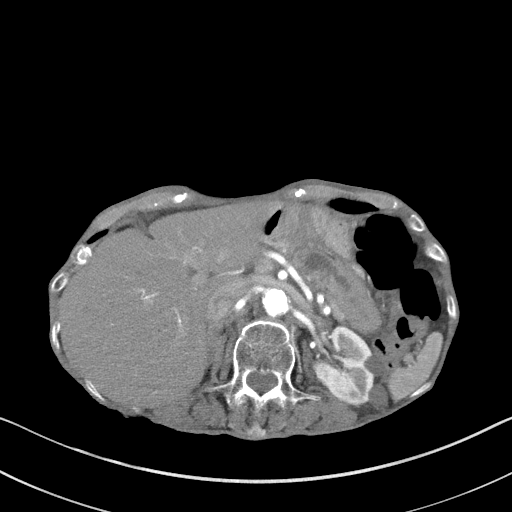
[im 56/74  lung]
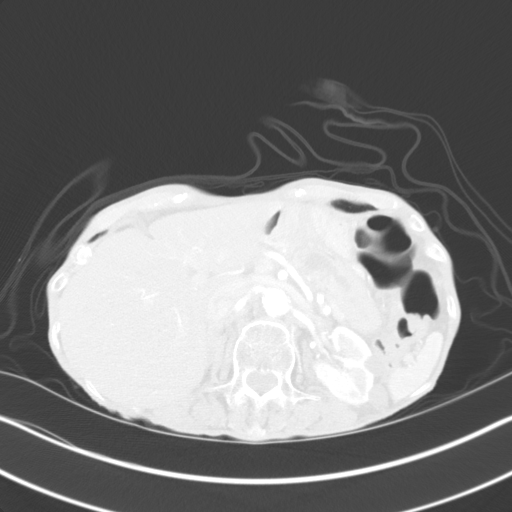
[im 61/74  lung]
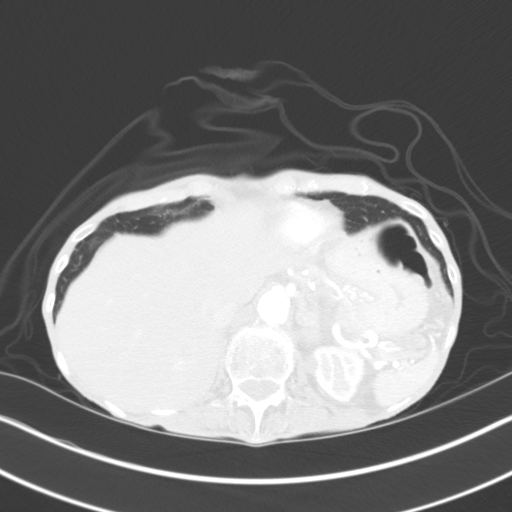
[im 65/74  soft-tissue]
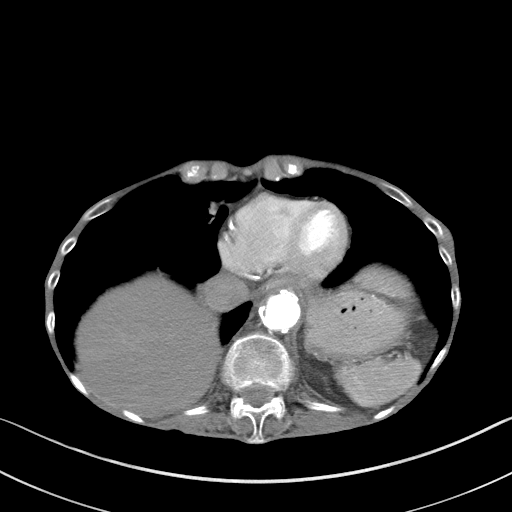
[im 65/74  lung]
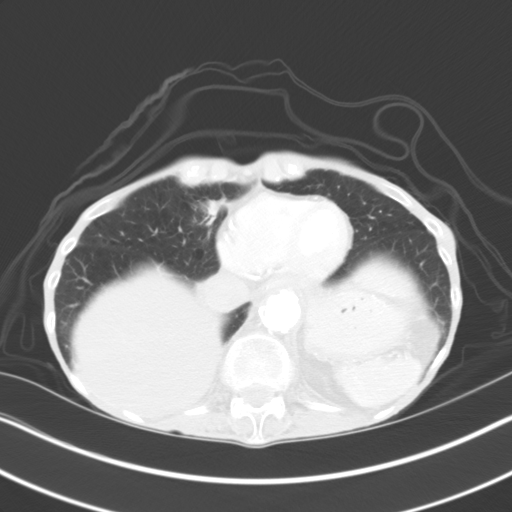
[im 69/74  soft-tissue]
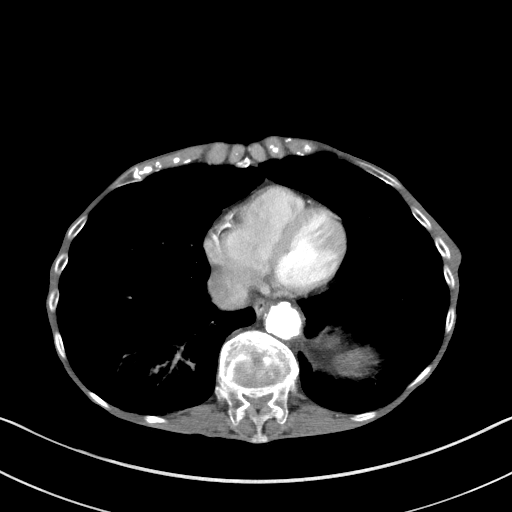
[im 69/74  lung]
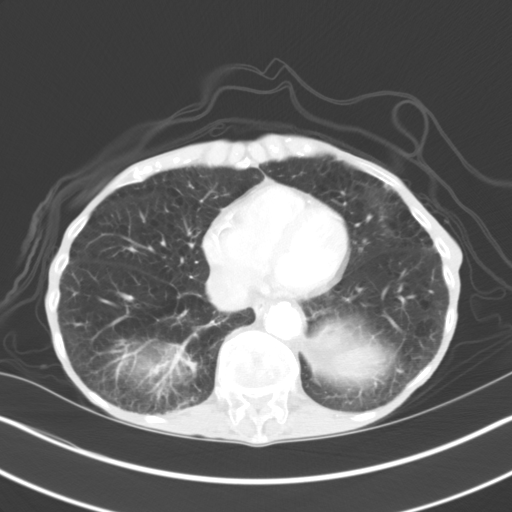

[13 of 32 positions shown; findings below may reference images not displayed]

FINDINGS: Lower chest: Advanced changes of emphysema identified within the
visualized portions of the lungs. Within the medial left lower lobe
there is a subpleural mass measuring 3.2 cm, image number [DATE].
Previously 2.1 cm.

Hepatobiliary: Peripherally enhancing low-density lesion within
segment 8 measures 1.9 cm, image [DATE]. Also in the right lobe of
liver is a 1.1 cm lesion, image [DATE]. Inferior right hepatic lobe
enhancing lesion measures 0.7 cm, image 38/4. Within segment 3 of
the left lobe of liver there is a peripherally enhancing lesion
measuring 7 mm, image [DATE]. New from previous exam. Gallbladder
appears normal. No biliary dilatation.

Pancreas: Abnormal dilatation of the pancreatic duct at the level of
the tail measures 1.1 cm in diameter, image [DATE]. There is a abrupt
cut off of the pancreatic duct within the body of pancreas where
there is a low-attenuation mass measuring 2.3 x 1.5 by 1.4 cm
suspicious for underlying pancreas adenocarcinoma.

Spleen: Normal in size without focal abnormality.

Adrenals/Urinary Tract: Left adrenal nodule was present on previous
exam measuring 1.9 cm. Unchanged. Unremarkable appearance of the
right adrenal gland. No bilateral kidney cysts noted. Urinary
bladder is unremarkable.

Stomach/Bowel: Stomach is within normal limits. No evidence of bowel
wall thickening, distention, or inflammatory changes.

Vascular/Lymphatic: Aortic atherosclerosis. No aneurysm. No
retroperitoneal adenopathy. No pelvic adenopathy.

Reproductive: The uterus is either atrophic or surgically absent. No
adnexal mass.

Other: No ascites.  No focal fluid collections.

Musculoskeletal: Scoliosis deformity is convex towards the left.
Chronic vertebral plana deformity involving the T12 vertebra is
again noted status post vertebroplasty. Age-indeterminate superior
endplate deformity involving the L4 vertebra noted. Previous Goorix
Roberto procedure.
IMPRESSION: 1. There is marked dilatation of the pancreatic duct at the level of
the tail of pancreas. Obstructing mass in the body of pancreas is
identified and concerning for pancreas adenocarcinoma. This could be
further assessed with contrast enhanced pancreas protocol MRI.
2. Multiple new peripherally enhancing liver lesions are identified
concerning for metastatic disease.
3. Mass within the left lower lobe has increased in size when
compared with 07/11/2016 and may reflect patient's known lung
cancer. This is only partially visualized.
4. Aortic atherosclerosis.

Aortic Atherosclerosis (8T74V-ABO.O) and Emphysema (8T74V-0GU.9).

These results will be called to the ordering clinician or
representative by the Radiologist Assistant, and communication
documented in the PACS or zVision Dashboard.
